# Patient Record
Sex: Male | Born: 1995 | Race: Black or African American | Hispanic: No | Marital: Single | Smoking: Never smoker
Health system: Southern US, Community
[De-identification: ages and names within clinical notes are randomized; demographics above are authoritative.]

## PROBLEM LIST (undated history)

## (undated) DIAGNOSIS — Q249 Congenital malformation of heart, unspecified: Secondary | ICD-10-CM

## (undated) HISTORY — PX: CARDIAC SURGERY: SHX584

---
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
## 2017-02-19 ENCOUNTER — Emergency Department (HOSPITAL_COMMUNITY): Payer: Medicaid Other

## 2017-02-19 ENCOUNTER — Encounter (HOSPITAL_COMMUNITY): Payer: Self-pay

## 2017-02-19 ENCOUNTER — Emergency Department (HOSPITAL_COMMUNITY)
Admission: EM | Admit: 2017-02-19 | Discharge: 2017-02-19 | Disposition: A | Discharge status: Home / Self Care | Payer: Medicaid Other | Attending: Emergency Medicine | Admitting: Emergency Medicine

## 2017-02-19 DIAGNOSIS — N451 Epididymitis: Secondary | ICD-10-CM | POA: Diagnosis not present

## 2017-02-19 DIAGNOSIS — N50811 Right testicular pain: Secondary | ICD-10-CM | POA: Diagnosis present

## 2017-02-19 DIAGNOSIS — R52 Pain, unspecified: Secondary | ICD-10-CM

## 2017-02-19 LAB — URINALYSIS, ROUTINE W REFLEX MICROSCOPIC
Bacteria, UA: NONE SEEN
Bilirubin Urine: NEGATIVE
GLUCOSE, UA: NEGATIVE mg/dL
KETONES UR: NEGATIVE mg/dL
NITRITE: NEGATIVE
PROTEIN: NEGATIVE mg/dL
Specific Gravity, Urine: 1.019 (ref 1.005–1.030)
pH: 6 (ref 5.0–8.0)

## 2017-02-19 MED ORDER — CIPROFLOXACIN HCL 500 MG PO TABS: 500.0000 mg | ORAL_TABLET | Freq: Two times a day (BID) | ORAL | 0 refills | Status: DC

## 2017-02-19 MED ORDER — HYDROCODONE-ACETAMINOPHEN 5-325 MG PO TABS
1.0000 | ORAL_TABLET | Freq: Once | ORAL | Status: AC
  Administered 2017-02-19: 1 via ORAL
  Filled 2017-02-19: qty 1

## 2017-02-19 MED ORDER — HYDROCODONE-ACETAMINOPHEN 5-325 MG PO TABS: 1.0000 | ORAL_TABLET | Freq: Four times a day (QID) | ORAL | 0 refills | Status: DC | PRN

## 2017-02-19 NOTE — Discharge Instructions (Signed)
Cipro as prescribed.  Ibuprofen 600 g every 6 hours as needed for pain. Hydrocodone as prescribed as needed for pain not relieved with ibuprofen.  Follow-up with your primary Dr. if not improving in the next 3-4 days, and return to the ER symptoms significantly worsen or change.

## 2017-02-19 NOTE — ED Triage Notes (Signed)
Per Pt, Pt reports right testicular pain that started yesterday. Pt denies any discharge or urinary symptoms. Reports that the left testicle is noted to be higher than the left one noted yesterday. Swelling in the right testicle.

## 2017-02-19 NOTE — ED Provider Notes (Signed)
[facility]-EMERGENCY DEPT Provider Note   CSN: [id number] Arrival date & time: 02/19/17  1250     History   Chief Complaint Chief Complaint  Patient presents with  . Testicle Pain    HPI Cole Hebert is a 21 y.o. male.  Patient is a 21 year old male otherwise healthy who presents for evaluation of right testicle pain. This began yesterday and is worsening. He describes pain with light touch and movement. He denies any difficulty urinating. He denies any fevers or chills. He has been sexually active in the past, but denies any sexual activity for several months.      Past Medical History:  Diagnosis Date  . Congenital heart defect     There are no active problems to display for this patient.   History reviewed. No pertinent surgical history.     Home Medications    Prior to Admission medications   Medication Sig Start Date End Date Taking? Authorizing Provider  azithromycin (ZITHROMAX) 250 MG tablet Take 1 tablet (250 mg total) by mouth daily. Take first 2 tablets together, then 1 every day until finished. 11/30/15   Danelle Berry, PA-C  enalapril (VASOTEC) 10 MG tablet Take 10 mg by mouth daily.    Historical Provider, MD  guaiFENesin-codeine 100-10 MG/5ML syrup Take 5 mLs by mouth 3 (three) times daily as needed for cough. 11/30/15   Danelle Berry, PA-C  ibuprofen (ADVIL,MOTRIN) 200 MG tablet Take 200 mg by mouth every 6 (six) hours as needed for mild pain.    Historical Provider, MD    Family History No family history on file.  Social History Social History  Substance Use Topics  . Smoking status: Never Smoker  . Smokeless tobacco: Never Used  . Alcohol use No     Allergies   Patient has no known allergies.   Review of Systems Review of Systems  All other systems reviewed and are negative.    Physical Exam Updated Vital Signs BP 123/70 (BP Location: Left Arm)   Pulse 67   Temp 97.3 F (36.3 C) (Oral)   Resp 16   SpO2 100%   Physical Exam    Constitutional: He is oriented to person, place, and time. He appears well-developed and well-nourished. No distress.  HENT:  Head: Normocephalic and atraumatic.  Mouth/Throat: Oropharynx is clear and moist.  Neck: Normal range of motion. Neck supple.  Cardiovascular: Normal rate and regular rhythm.  Exam reveals no friction rub.   No murmur heard. Pulmonary/Chest: Effort normal.  Genitourinary: Penis normal.  Genitourinary Comments: There is exquisite tenderness of the right testicle. There is no obvious swelling or redness. Exam is somewhat limited secondary to patient noncompliance, however the testicle does not appear to be high riding and appears mobile.  Musculoskeletal: Normal range of motion. He exhibits no edema.  Neurological: He is alert and oriented to person, place, and time. Coordination normal.  Skin: Skin is warm and dry. He is not diaphoretic.  Nursing note and vitals reviewed.    ED Treatments / Results  Labs (all labs ordered are listed, but only abnormal results are displayed) Labs Reviewed  URINALYSIS, ROUTINE W REFLEX MICROSCOPIC    EKG  EKG Interpretation None       Radiology No results found.  Procedures Procedures (including critical care time)  Medications Ordered in ED Medications  HYDROcodone-acetaminophen (NORCO/VICODIN) 5-325 MG per tablet 1 tablet (not administered)     Initial Impression / Assessment and Plan / ED Course  I have reviewed the  triage vital signs and the nursing notes.  Pertinent labs & imaging results that were available during my care of the patient were reviewed by me and considered in my medical decision making (see chart for details).  Ultrasound shows epididymitis. This will be treated with Cipro, pain medication, and as needed follow-up with his primary doctor.  Final Clinical Impressions(s) / ED Diagnoses   Final diagnoses:  None    New Prescriptions New Prescriptions   No medications on file      Geoffery Lyonsouglas Davien Malone, MD 02/19/17 1702

## 2017-09-08 ENCOUNTER — Encounter (HOSPITAL_COMMUNITY): Payer: Self-pay | Admitting: Emergency Medicine

## 2017-09-08 ENCOUNTER — Emergency Department (HOSPITAL_COMMUNITY)
Admission: EM | Admit: 2017-09-08 | Discharge: 2017-09-08 | Disposition: A | Discharge status: Home / Self Care | Payer: Medicaid Other | Attending: Emergency Medicine | Admitting: Emergency Medicine

## 2017-09-08 DIAGNOSIS — N50812 Left testicular pain: Secondary | ICD-10-CM | POA: Diagnosis present

## 2017-09-08 DIAGNOSIS — Z79899 Other long term (current) drug therapy: Secondary | ICD-10-CM | POA: Diagnosis not present

## 2017-09-08 DIAGNOSIS — N341 Nonspecific urethritis: Secondary | ICD-10-CM | POA: Insufficient documentation

## 2017-09-08 DIAGNOSIS — N451 Epididymitis: Secondary | ICD-10-CM | POA: Diagnosis not present

## 2017-09-08 DIAGNOSIS — N342 Other urethritis: Secondary | ICD-10-CM

## 2017-09-08 LAB — URINALYSIS, ROUTINE W REFLEX MICROSCOPIC
BACTERIA UA: NONE SEEN
BILIRUBIN URINE: NEGATIVE
GLUCOSE, UA: NEGATIVE mg/dL
KETONES UR: NEGATIVE mg/dL
NITRITE: NEGATIVE
PH: 6 (ref 5.0–8.0)
PROTEIN: 30 mg/dL — AB
SQUAMOUS EPITHELIAL / LPF: NONE SEEN
Specific Gravity, Urine: 1.023 (ref 1.005–1.030)

## 2017-09-08 MED ORDER — AZITHROMYCIN 250 MG PO TABS
1000.0000 mg | ORAL_TABLET | Freq: Once | ORAL | Status: AC
  Administered 2017-09-08: 1000 mg via ORAL
  Filled 2017-09-08: qty 4

## 2017-09-08 MED ORDER — LIDOCAINE HCL (PF) 1 % IJ SOLN
2.0000 mL | Freq: Once | INTRAMUSCULAR | Status: AC
  Administered 2017-09-08: 2 mL
  Filled 2017-09-08: qty 5

## 2017-09-08 MED ORDER — AZITHROMYCIN 250 MG PO TABS: ORAL_TABLET | ORAL | 0 refills | Status: DC

## 2017-09-08 MED ORDER — CEFTRIAXONE SODIUM 250 MG IJ SOLR
250.0000 mg | Freq: Once | INTRAMUSCULAR | Status: AC
  Administered 2017-09-08: 250 mg via INTRAMUSCULAR
  Filled 2017-09-08: qty 250

## 2017-09-08 NOTE — ED Provider Notes (Signed)
[facility]EM[facility] DEPT Provider Note   CSN: [id number] Arrival date & time: 09/08/17  1737     History   Chief Complaint Chief Complaint  Patient presents with  . Testicle Pain    HPI Cole Hebert is a 21 y.o. male. Chief complaint is testicle pain, and urethral discharge.  HPI 21 year old male. Denies any prior STIs. States he's had urethral discharge for a week. He has some soreness in his left testicle 5 days ago. Now his right testicle feels swollen, for the last 3 days. It has been constant, not intermittent. No trauma..  No history of suicidal blisters, ulcers, chancres, no inguinal pain swelling or lymph node enlargement.  Past Medical History:  Diagnosis Date  . Congenital heart defect     There are no active problems to display for this patient.   History reviewed. No pertinent surgical history.     Home Medications    Prior to Admission medications   Medication Sig Start Date End Date Taking? Authorizing Provider  azithromycin (ZITHROMAX Z-PAK) 250 MG tablet As directed 09/08/17   Rolland Porter, MD  ciprofloxacin (CIPRO) 500 MG tablet Take 1 tablet (500 mg total) by mouth 2 (two) times daily. One po bid x 7 days 02/19/17   Geoffery Lyons, MD  enalapril (VASOTEC) 10 MG tablet Take 10 mg by mouth daily.    [provider]  guaiFENesin-codeine 100-10 MG/5ML syrup Take 5 mLs by mouth 3 (three) times daily as needed for cough. Patient not taking: Reported on 02/19/2017 11/30/15   Danelle Berry, PA-C  HYDROcodone-acetaminophen (NORCO) 5-325 MG tablet Take 1-2 tablets by mouth every 6 (six) hours as needed. 02/19/17   Geoffery Lyons, MD    Family History No family history on file.  Social History Social History  Substance Use Topics  . Smoking status: Current Every Day Smoker  . Smokeless tobacco: Never Used  . Alcohol use No     Allergies   Patient has no known allergies.   Review of Systems Review of Systems  Constitutional: Negative for appetite  change, chills, diaphoresis, fatigue and fever.  HENT: Negative for mouth sores, sore throat and trouble swallowing.   Eyes: Negative for visual disturbance.  Respiratory: Negative for cough, chest tightness, shortness of breath and wheezing.   Cardiovascular: Negative for chest pain.  Gastrointestinal: Negative for abdominal distention, abdominal pain, diarrhea, nausea and vomiting.  Endocrine: Negative for polydipsia, polyphagia and polyuria.  Genitourinary: Positive for discharge and testicular pain. Negative for dysuria, frequency and hematuria.  Musculoskeletal: Negative for gait problem.  Skin: Negative for color change, pallor and rash.  Neurological: Negative for dizziness, syncope, light-headedness and headaches.  Hematological: Does not bruise/bleed easily.  Psychiatric/Behavioral: Negative for behavioral problems and confusion.     Physical Exam Updated Vital Signs BP 114/64 (BP Location: Right Arm)   Pulse 82   Temp 98.5 F (36.9 C) (Oral)   Resp 17   Ht  (1.676 m)   Wt 51.8 kg (114 lb 4.8 oz)   SpO2 99%   BMI 18.45 kg/m   Physical Exam  Constitutional: He is oriented to person, place, and time. He appears well-developed and well-nourished. No distress.  HENT:  Head: Normocephalic.  Eyes: Pupils are equal, round, and reactive to light. Conjunctivae are normal. No scleral icterus.  Neck: Normal range of motion. Neck supple. No thyromegaly present.  Cardiovascular: Normal rate and regular rhythm.  Exam reveals no gallop and no friction rub.   No murmur heard. Pulmonary/Chest: Effort  normal and breath sounds normal. No respiratory distress. He has no wheezes. He has no rales.  Abdominal: Soft. Bowel sounds are normal. He exhibits no distension. There is no tenderness. There is no rebound.  Genitourinary:  Genitourinary Comments: No inguinal adenopathy. No ulcerations, sores, or blistering of the shaft, glans, or foreskin. There is some clear discharge. Left  testicle exam is normal. The right testicle examines suggestive of tenderness along the posterior aspect of the testicle. The testicle is not high riding. It is of normal vertical lie. There is a normal Prehns sign. Normal cremasteric reflexes bilaterally.  Musculoskeletal: Normal range of motion.  Neurological: He is alert and oriented to person, place, and time.  Skin: Skin is warm and dry. No rash noted.  Psychiatric: He has a normal mood and affect. His behavior is normal.     ED Treatments / Results  Labs (all labs ordered are listed, but only abnormal results are displayed) Labs Reviewed  URINALYSIS, ROUTINE W REFLEX MICROSCOPIC - Abnormal; Notable for the following:       Result Value   APPearance CLOUDY (*)    Hgb urine dipstick SMALL (*)    Protein, ur 30 (*)    Leukocytes, UA LARGE (*)    Non Squamous Epithelial 0-5 (*)    All other components within normal limits  GC/CHLAMYDIA PROBE AMP ([place]) NOT AT [facility]    EKG  EKG Interpretation None       Radiology No results found.  Procedures Procedures (including critical care time)  Medications Ordered in ED Medications  azithromycin (ZITHROMAX) tablet 1,000 mg (not administered)  cefTRIAXone (ROCEPHIN) injection 250 mg (not administered)     Initial Impression / Assessment and Plan / ED Course  I have reviewed the triage vital signs and the nursing notes.  Pertinent labs & imaging results that were available during my care of the patient were reviewed by me and considered in my medical decision making (see chart for details).     No concern of torsion. Constant pain. Previous Left sided pain. Reassuring Prehns and cremasteric. UA with TNTC WBC. GC and Chamydia ordered. Will treat for Epididymitis and urethritis with IM rocephin, PO zithromax here. Rx for zithromax for 5 days. Partner eval recommended.  Final Clinical Impressions(s) / ED Diagnoses   Final diagnoses:  Epididymitis  Urethritis    New  Prescriptions New Prescriptions   AZITHROMYCIN (ZITHROMAX Z-PAK) 250 MG TABLET    As directed     Rolland Porter, MD 09/08/17 1947

## 2017-09-08 NOTE — ED Notes (Signed)
No s/s of reaction noted.  Discharged at this time.

## 2017-09-08 NOTE — ED Notes (Signed)
Rocephin injection given at this time.  To discharge once no reaction noted.

## 2017-09-08 NOTE — ED Triage Notes (Signed)
C/o constant R testicle pain x 2-3 days and intermittent L testicle pain.  Reports burning with urination and clear penile discharge.

## 2017-09-08 NOTE — Discharge Instructions (Signed)
Your testicular pain, and penile discharge are from a sexually transmitted infection.  You should notify all sexual partners that you have been diagnosed with a sexually transmitted infection. They need to be evaluated and treated with this can be passed on to other individuals, or back to you.  Fill the prescription for Zithromax. Take it as directed until it is gone or this infection can come back worse, and not go away with additional treatment

## 2017-09-09 LAB — GC/CHLAMYDIA PROBE AMP (~~LOC~~) NOT AT ARMC
CHLAMYDIA, DNA PROBE: POSITIVE — AB
NEISSERIA GONORRHEA: POSITIVE — AB

## 2018-01-30 ENCOUNTER — Other Ambulatory Visit: Payer: Self-pay

## 2018-01-30 ENCOUNTER — Encounter (HOSPITAL_COMMUNITY): Payer: Self-pay

## 2018-01-30 ENCOUNTER — Emergency Department (HOSPITAL_COMMUNITY)
Admission: EM | Admit: 2018-01-30 | Discharge: 2018-01-30 | Disposition: A | Discharge status: Home / Self Care | Payer: Medicaid Other | Attending: Emergency Medicine | Admitting: Emergency Medicine

## 2018-01-30 DIAGNOSIS — H5711 Ocular pain, right eye: Secondary | ICD-10-CM | POA: Diagnosis present

## 2018-01-30 DIAGNOSIS — H109 Unspecified conjunctivitis: Secondary | ICD-10-CM

## 2018-01-30 DIAGNOSIS — H1031 Unspecified acute conjunctivitis, right eye: Secondary | ICD-10-CM | POA: Diagnosis not present

## 2018-01-30 MED ORDER — OFLOXACIN 0.3 % OP SOLN: 1.0000 [drp] | Freq: Four times a day (QID) | OPHTHALMIC | 0 refills | Status: DC

## 2018-01-30 NOTE — ED Triage Notes (Signed)
Pt endorses right eye irritation x 3 days. Redness noted to right eye, no visual changes or fevers. VSS.

## 2018-01-30 NOTE — Discharge Instructions (Signed)
Please follow up with optho in 2 days. Use drops as directed. Follow attached handouts. Do not wear contacts. Wash hands frequently. If you develop worsening or new concerning symptoms you can return to the emergency department for re-evaluation.

## 2018-01-30 NOTE — ED Notes (Signed)
Please note: Visual acuity test was performed without glasses. Pt was asked if he wears glasses or contacts at home, and his mother stated that "he doesn't at all, but he's supposed to". Pt stated that he is supposedly farsighted.

## 2018-01-30 NOTE — ED Provider Notes (Signed)
[facility]   CSN: [id number] Arrival date & time: 01/30/18  1319     History   Chief Complaint Chief Complaint  Patient presents with  . Conjunctivitis    HPI Cole Hebert is a 22 y.o. male who presents for right arm redness.  Patient states that his friend recently was diagnosed with pinkeye.  He notes 3 days of eye redness, clear discharge and crusting shut in the morning.  He has not tried any interventions for his symptoms.  Denies any left eye redness or preceding URI symptoms. Denies fever, HA, N/V, loss of vision, changes in vision, flashers, floaters, blurring, diplopia, photophobia, FB sensation,  trauma, rash, pain or painful EOM.  Patient does note that he is supposed to wear glasses but does not.  No contact use.   HPI  Past Medical History:  Diagnosis Date  . Congenital heart defect     There are no active problems to display for this patient.   History reviewed. No pertinent surgical history.     Home Medications    Prior to Admission medications   Medication Sig Start Date End Date Taking? Authorizing Provider  azithromycin (ZITHROMAX Z-PAK) 250 MG tablet As directed 09/08/17   Rolland PorterJames, Mark, MD  ciprofloxacin (CIPRO) 500 MG tablet Take 1 tablet (500 mg total) by mouth 2 (two) times daily. One po bid x 7 days 02/19/17   Geoffery Lyonselo, Douglas, MD  enalapril (VASOTEC) 10 MG tablet Take 10 mg by mouth daily.    [provider]  guaiFENesin-codeine 100-10 MG/5ML syrup Take 5 mLs by mouth 3 (three) times daily as needed for cough. Patient not taking: Reported on 02/19/2017 11/30/15   Danelle Berryapia, Leisa, PA-C  HYDROcodone-acetaminophen (NORCO) 5-325 MG tablet Take 1-2 tablets by mouth every 6 (six) hours as needed. 02/19/17   Geoffery Lyonselo, Douglas, MD    Family History History reviewed. No pertinent family history.  Social History Social History   Tobacco Use  . Smoking status: Never Smoker  . Smokeless tobacco: Never  Used  Substance Use Topics  . Alcohol use: No  . Drug use: Yes    Types: Marijuana    Comment: every other day     Allergies   Patient has no known allergies.   Review of Systems Review of Systems  All other systems reviewed and are negative.    Physical Exam Updated Vital Signs BP 126/67 (BP Location: Right Arm)   Temp 98 F (36.7 C) (Oral)   Resp 16   Ht 5\' 6"  (1.676 m)   Wt 51.7 kg (114 lb)   SpO2 99%   BMI 18.40 kg/m   Physical Exam  Constitutional: He appears well-developed and well-nourished. No distress.  HENT:  Head: Normocephalic and atraumatic.  Right Ear: Hearing, tympanic membrane, external ear and ear canal normal. No foreign bodies. Tympanic membrane is not injected, not perforated, not erythematous, not retracted and not bulging.  Left Ear: Hearing, tympanic membrane, external ear and ear canal normal. No foreign bodies. Tympanic membrane is not injected, not perforated, not erythematous, not retracted and not bulging.  Nose: Nose normal. No mucosal edema, rhinorrhea, sinus tenderness, septal deviation or nasal septal hematoma.  No foreign bodies. Right sinus exhibits no maxillary sinus tenderness and no frontal sinus tenderness. Left sinus exhibits no maxillary sinus tenderness and no frontal sinus tenderness.  The patient has normal phonation and is in control of secretions. No stridor.  Midline uvula without edema. Soft palate rises  symmetrically.  No tonsillar erythema or exudates. No PTA. Tongue protrusion is normal. No trismus. No creptius on neck palpation and patient has good dentition. No gingival erythema or fluctuance noted. Mucus membranes moist.  Eyes: Conjunctivae, EOM and lids are normal. Pupils are equal, round, and reactive to light. Right eye exhibits no discharge. Left eye exhibits no discharge. Right conjunctiva is not injected. Left conjunctiva is not injected.  Appearance (Right) There is erythema of the conjunctiva and sclera. No scleral  icterus. Mild watery discharge. Crusting on the lower eyelid. PEERL intact. EOMI without nystagmus or pain. No photophobia or consensual photophobia.  Corneal Abrasion Exam VCO. Risks, benefits and alternatives explained. 1 drops of tetracaine (PONTOCAINE) 0.5 % ophthalmic solution were applied to the right eye. Fluorescein 1 MG ophthalmic strip applied the the surface of the right eye Wood's lamp used to screen for abrasion. No increased fluorescein uptake. No corneal ulcer. Negative Seidel sign. No foreign bodies noted. No visible hyphema.  Patient tolerated the procedure well No periorbital swelling or erythema.  Neck: Trachea normal, normal range of motion, full passive range of motion without pain and phonation normal. Neck supple. No spinous process tenderness and no muscular tenderness present. No neck rigidity. No tracheal deviation and normal range of motion present.  Cardiovascular: Normal rate and regular rhythm.  Pulmonary/Chest: Effort normal.  Abdominal: Soft.  Lymphadenopathy:       Head (right side): No preauricular adenopathy present.       Head (left side): No preauricular adenopathy present.    He has no cervical adenopathy.  Neurological: He is alert.  Skin: Skin is warm and dry. No rash noted.  No vesicular-like rash visualized  Psychiatric: He has a normal mood and affect.  Nursing note and vitals reviewed.    ED Treatments / Results  Labs (all labs ordered are listed, but only abnormal results are displayed) Labs Reviewed - No data to display  EKG  EKG Interpretation None       Radiology No results found.  Procedures Procedures (including critical care time)  Medications Ordered in ED Medications - No data to display   Initial Impression / Assessment and Plan / ED Course  I have reviewed the triage vital signs and the nursing notes.  Pertinent labs & imaging results that were available during my care of the patient were reviewed by me and  considered in my medical decision making (see chart for details).      22 y.o. male with 3 day history of red eye, drainage and eye crusting in the morning. Pt dx likely  conjunctivitis based on presentation & eye exam.Will d/c on ofloxacin drops.   No evidence of HSV or VSV infection. Pt is not a contact lens wearer.  Exam non-concerning for orbital cellulitis, hyphema, corneal ulcers, corneal abrasions or trauma.  Patient has been instructed to use cool compresses and practice personal hygiene with frequent hand washing.  Patient's visual acuity at baseline.  He is supposed to wear glasses but does not.  Patient understands to follow up with ophthalmology, especially if new symptoms including change in vision, increased drainage, or entrapment occur.  Strict return precautions discussed. Patient appears safe for discharge.   Final Clinical Impressions(s) / ED Diagnoses   Final diagnoses:  Conjunctivitis of right eye, unspecified conjunctivitis type    ED Discharge Orders        Ordered    ofloxacin (OCUFLOX) 0.3 % ophthalmic solution  4 times daily  01/30/18 1515       Jacinto Halim, PA-C 01/30/18 1516    Margarita Grizzle, MD 01/31/18 [phone]

## 2018-01-30 NOTE — ED Notes (Signed)
See provider assessment 

## 2018-04-09 ENCOUNTER — Emergency Department (HOSPITAL_COMMUNITY)
Admission: EM | Admit: 2018-04-09 | Discharge: 2018-04-09 | Disposition: A | Discharge status: Home / Self Care | Payer: Medicaid Other | Attending: Emergency Medicine | Admitting: Emergency Medicine

## 2018-04-09 ENCOUNTER — Encounter (HOSPITAL_COMMUNITY): Payer: Self-pay

## 2018-04-09 ENCOUNTER — Other Ambulatory Visit: Payer: Self-pay

## 2018-04-09 DIAGNOSIS — W2209XA Striking against other stationary object, initial encounter: Secondary | ICD-10-CM | POA: Insufficient documentation

## 2018-04-09 DIAGNOSIS — S65211A Laceration of superficial palmar arch of right hand, initial encounter: Secondary | ICD-10-CM | POA: Insufficient documentation

## 2018-04-09 DIAGNOSIS — Z23 Encounter for immunization: Secondary | ICD-10-CM | POA: Diagnosis not present

## 2018-04-09 DIAGNOSIS — S6991XA Unspecified injury of right wrist, hand and finger(s), initial encounter: Secondary | ICD-10-CM

## 2018-04-09 DIAGNOSIS — Y9389 Activity, other specified: Secondary | ICD-10-CM | POA: Diagnosis not present

## 2018-04-09 DIAGNOSIS — Y9289 Other specified places as the place of occurrence of the external cause: Secondary | ICD-10-CM | POA: Diagnosis not present

## 2018-04-09 DIAGNOSIS — Y999 Unspecified external cause status: Secondary | ICD-10-CM | POA: Insufficient documentation

## 2018-04-09 MED ORDER — TETANUS-DIPHTH-ACELL PERTUSSIS 5-2.5-18.5 LF-MCG/0.5 IM SUSP
0.5000 mL | Freq: Once | INTRAMUSCULAR | Status: AC
  Administered 2018-04-09: 0.5 mL via INTRAMUSCULAR
  Filled 2018-04-09: qty 0.5

## 2018-04-09 NOTE — Discharge Instructions (Signed)
Please follow up with your primary care provider within 5-7 days for re-evaluation of your symptoms. If you do not have a primary care provider, information for a healthcare clinic has been provided for you to make arrangements for follow up care.  Please return to the emergency room immediately if you experience any new or worsening symptoms or any symptoms that indicate worsening infection such as fevers, increased redness/swelling/pain, warmth, or drainage from the affected area.    

## 2018-04-09 NOTE — ED Triage Notes (Signed)
Pt arrived from home, states he cut his right palm on a metal fence yesterday. Unsure of when last tetanus shot was.

## 2018-04-09 NOTE — ED Provider Notes (Signed)
[facility] EMERGENCY DEPARTMENT Provider Note   CSN: [id number] Arrival date & time: 04/09/18  0847     History   Chief Complaint Chief Complaint  Patient presents with  . Hand Injury    HPI Cole Hebert is a 22 y.o. male.  HPI    Pt Is a 22 year old male presenting to the ED today to be evaluated for hand injury that occurred yesterday.  Patient states that he jumped a fence when he was running from a dog yesterday and cut the palm of his right hand on the fence.  He has 3 cuts to his hand.  Denies numbness, tingling, weakness to the hand.  Denies pain to the hand.  He has not had any fevers or signs of infection.  Denies any other injuries.  Denies exacerbating or alleviating factors.  Injury occurred suddenly.  He is not sure when his last tetanus shot was.  Past Medical History:  Diagnosis Date  . Congenital heart defect     There are no active problems to display for this patient.   History reviewed. No pertinent surgical history.      Home Medications    Prior to Admission medications   Medication Sig Start Date End Date Taking? Authorizing Provider  azithromycin (ZITHROMAX Z-PAK) 250 MG tablet As directed 09/08/17   Rolland Porter, MD  ciprofloxacin (CIPRO) 500 MG tablet Take 1 tablet (500 mg total) by mouth 2 (two) times daily. One po bid x 7 days 02/19/17   Geoffery Lyons, MD  enalapril (VASOTEC) 10 MG tablet Take 10 mg by mouth daily.    [provider]  guaiFENesin-codeine 100-10 MG/5ML syrup Take 5 mLs by mouth 3 (three) times daily as needed for cough. Patient not taking: Reported on 02/19/2017 11/30/15   Danelle Berry, PA-C  HYDROcodone-acetaminophen (NORCO) 5-325 MG tablet Take 1-2 tablets by mouth every 6 (six) hours as needed. 02/19/17   Geoffery Lyons, MD  ofloxacin (OCUFLOX) 0.3 % ophthalmic solution Place 1 drop into the right eye 4 (four) times daily. 01/30/18   Maczis, Elmer Sow, PA-C    Family History No family history on  file.  Social History Social History   Tobacco Use  . Smoking status: Never Smoker  . Smokeless tobacco: Never Used  Substance Use Topics  . Alcohol use: No  . Drug use: Yes    Types: Marijuana    Comment: every other day     Allergies   Patient has no known allergies.   Review of Systems Review of Systems  Constitutional: Negative for fever.  Musculoskeletal:       Right hand injury  Skin: Positive for wound.  Neurological: Negative for weakness and numbness.     Physical Exam Updated Vital Signs BP 121/66 (BP Location: Right Arm)   Pulse 72   Temp 98.4 F (36.9 C) (Oral)   Resp 16   Ht  (1.676 m)   Wt 52.2 kg (115 lb)   SpO2 98%   BMI 18.56 kg/m   Physical Exam  Constitutional: He is oriented to person, place, and time. He appears well-developed and well-nourished. No distress.  HENT:  Head: Normocephalic and atraumatic.  Eyes: Conjunctivae are normal.  Cardiovascular: Normal rate.  Pulmonary/Chest: Effort normal.  Musculoskeletal:  Multiple small superficial wounds to right palm. Largest is flap injury to epidermis that is 0.5cm, two <0.5cm wound to palm, no active bleeding, no FB, 5/5 grip strength bilat, normal pincer strength, thumb extension, and finger  abduction to all figners bilat, brisk cap refill and normal sensation  Neurological: He is alert and oriented to person, place, and time.  Skin: Skin is warm and dry.     ED Treatments / Results  Labs (all labs ordered are listed, but only abnormal results are displayed) Labs Reviewed - No data to display  EKG None  Radiology No results found.  Procedures Procedures (including critical care time)  Medications Ordered in ED Medications  Tdap (BOOSTRIX) injection 0.5 mL (has no administration in time range)     Initial Impression / Assessment and Plan / ED Course  I have reviewed the triage vital signs and the nursing notes.  Pertinent labs & imaging results that were available  during my care of the patient were reviewed by me and considered in my medical decision making (see chart for details).    Final Clinical Impressions(s) / ED Diagnoses   Final diagnoses:  Injury of right hand, initial encounter   Patient presenting with right hand wounds to the palm of the hand.  No foreign bodies noted.  Patient irrigated wound under tap water and wash with soap and water in the ED.  No foreign bodies identified through full range of motion.  Tetanus updated.  Do not feel antibiotics are indicated at this time as wounds are clean and patient has no immune suppression that would delay wound healing.  Advised PCP follow-up in a week and return for any signs of infection.  All questions answered and patient understands plan and reasons to return immediately to the ED.  ED Discharge Orders    None       Karrie Meres, [place] 04/09/18 6962    Little, Ambrose Finland, MD 04/09/18 [phone]

## 2018-05-12 ENCOUNTER — Emergency Department (HOSPITAL_COMMUNITY)
Admission: EM | Admit: 2018-05-12 | Discharge: 2018-05-12 | Disposition: A | Discharge status: Home / Self Care | Payer: Medicaid Other | Attending: Emergency Medicine | Admitting: Emergency Medicine

## 2018-05-12 ENCOUNTER — Encounter (HOSPITAL_COMMUNITY): Payer: Self-pay | Admitting: Emergency Medicine

## 2018-05-12 ENCOUNTER — Other Ambulatory Visit: Payer: Self-pay

## 2018-05-12 DIAGNOSIS — N451 Epididymitis: Secondary | ICD-10-CM | POA: Diagnosis not present

## 2018-05-12 DIAGNOSIS — Z79899 Other long term (current) drug therapy: Secondary | ICD-10-CM | POA: Diagnosis not present

## 2018-05-12 DIAGNOSIS — N5089 Other specified disorders of the male genital organs: Secondary | ICD-10-CM | POA: Diagnosis present

## 2018-05-12 LAB — URINALYSIS, ROUTINE W REFLEX MICROSCOPIC
BILIRUBIN URINE: NEGATIVE
GLUCOSE, UA: NEGATIVE mg/dL
Ketones, ur: NEGATIVE mg/dL
Nitrite: NEGATIVE
PH: 6 (ref 5.0–8.0)
PROTEIN: NEGATIVE mg/dL
Specific Gravity, Urine: 1.021 (ref 1.005–1.030)

## 2018-05-12 MED ORDER — DOXYCYCLINE HYCLATE 100 MG PO CAPS: 100.0000 mg | ORAL_CAPSULE | Freq: Two times a day (BID) | ORAL | 0 refills | Status: DC

## 2018-05-12 MED ORDER — CEFTRIAXONE SODIUM 250 MG IJ SOLR
250.0000 mg | Freq: Once | INTRAMUSCULAR | Status: AC
  Administered 2018-05-12: 250 mg via INTRAMUSCULAR
  Filled 2018-05-12: qty 250

## 2018-05-12 MED ORDER — STERILE WATER FOR INJECTION IJ SOLN
INTRAMUSCULAR | Status: AC
  Administered 2018-05-12: 1.2 mL
  Filled 2018-05-12: qty 10

## 2018-05-12 NOTE — ED Provider Notes (Signed)
[facility]   CSN: [id number] Arrival date & time: 05/12/18  1015     History   Chief Complaint Chief Complaint  Patient presents with  . Testicle Pain    HPI Cole Hebert is a 22 y.o. male.  HPI   22 year old male presents today with complaints of left testicular pain.  Patient notes over the last 2 months he has had purulent penile discharge and left testicular pain.  He notes this is worse on the backside of the testicle, denies any swelling or edema to the scrotum, changes in size of the testicle or any masses.  Patient reports he is sexually active with women only.  She denies any abdominal pain fever.  Past Medical History:  Diagnosis Date  . Congenital heart defect     There are no active problems to display for this patient.   Past Surgical History:  Procedure Laterality Date  . CARDIAC SURGERY          Home Medications    Prior to Admission medications   Medication Sig Start Date End Date Taking? Authorizing Provider  azithromycin (ZITHROMAX Z-PAK) 250 MG tablet As directed 09/08/17   Rolland PorterJames, Mark, MD  ciprofloxacin (CIPRO) 500 MG tablet Take 1 tablet (500 mg total) by mouth 2 (two) times daily. One po bid x 7 days 02/19/17   Geoffery Lyonselo, Douglas, MD  doxycycline (VIBRAMYCIN) 100 MG capsule Take 1 capsule (100 mg total) by mouth 2 (two) times daily. 05/12/18   Ainara Eldridge, Tinnie GensJeffrey, PA-C  enalapril (VASOTEC) 10 MG tablet Take 10 mg by mouth daily.    [provider]  guaiFENesin-codeine 100-10 MG/5ML syrup Take 5 mLs by mouth 3 (three) times daily as needed for cough. Patient not taking: Reported on 02/19/2017 11/30/15   Danelle Berryapia, Leisa, PA-C  HYDROcodone-acetaminophen (NORCO) 5-325 MG tablet Take 1-2 tablets by mouth every 6 (six) hours as needed. 02/19/17   Geoffery Lyonselo, Douglas, MD  ofloxacin (OCUFLOX) 0.3 % ophthalmic solution Place 1 drop into the right eye 4 (four) times daily. 01/30/18   Maczis, Elmer SowMichael M, PA-C     Family History History reviewed. No pertinent family history.  Social History Social History   Tobacco Use  . Smoking status: Never Smoker  . Smokeless tobacco: Never Used  Substance Use Topics  . Alcohol use: No  . Drug use: Yes    Types: Marijuana    Comment: every other day     Allergies   Patient has no known allergies.   Review of Systems Review of Systems  All other systems reviewed and are negative.    Physical Exam Updated Vital Signs BP 132/78 (BP Location: Right Arm)   Pulse 86   Temp 98.1 F (36.7 C) (Oral)   Resp 16   Ht 5\' 6"  (1.676 m)   Wt 49.9 kg (110 lb)   SpO2 98%   BMI 17.75 kg/m   Physical Exam  Constitutional: He is oriented to person, place, and time. He appears well-developed and well-nourished.  HENT:  Head: Normocephalic and atraumatic.  Eyes: Pupils are equal, round, and reactive to light. Conjunctivae are normal. Right eye exhibits no discharge. Left eye exhibits no discharge. No scleral icterus.  Neck: Normal range of motion. No JVD present. No tracheal deviation present.  Pulmonary/Chest: Effort normal. No stridor.  Genitourinary:  Genitourinary Comments: Bilateral descended testicles, no masses, no scrotal swelling, tenderness along the posterior left testicle along the epididymis -no penile discharge  Neurological: He is  alert and oriented to person, place, and time. Coordination normal.  Psychiatric: He has a normal mood and affect. His behavior is normal. Judgment and thought content normal.  Nursing note and vitals reviewed.    ED Treatments / Results  Labs (all labs ordered are listed, but only abnormal results are displayed) Labs Reviewed  URINALYSIS, ROUTINE W REFLEX MICROSCOPIC - Abnormal; Notable for the following components:      Result Value   APPearance HAZY (*)    Hgb urine dipstick MODERATE (*)    Leukocytes, UA MODERATE (*)    WBC, UA >50 (*)    Bacteria, UA RARE (*)    All other components within  normal limits  GC/CHLAMYDIA PROBE AMP ([place]) NOT AT [facility]    EKG None  Radiology No results found.  Procedures Procedures (including critical care time)  Medications Ordered in ED Medications  cefTRIAXone (ROCEPHIN) injection 250 mg (250 mg Intramuscular Given 05/12/18 1217)  sterile water (preservative free) injection (1.2 mLs  Given 05/12/18 1217)     Initial Impression / Assessment and Plan / ED Course  I have reviewed the triage vital signs and the nursing notes.  Pertinent labs & imaging results that were available during my care of the patient were reviewed by me and considered in my medical decision making (see chart for details).     22 year old male presents today with likely epididymitis secondary to STD.  Patient having penile discharge.  He will be treated with ceftriaxone and doxy at home.  No signs of trichomoniasis in his urine, he is discharged with strict return precautions and follow-up information.  If patient continues to endorse testicular pain after antibiotics he will return for reevaluation.  Final Clinical Impressions(s) / ED Diagnoses   Final diagnoses:  Epididymitis    ED Discharge Orders        Ordered    doxycycline (VIBRAMYCIN) 100 MG capsule  2 times daily     05/12/18 1247       Freedom Peddy, Josie Dixon 05/12/18 1247    Benjiman Core, MD 05/12/18 [phone]

## 2018-05-12 NOTE — Discharge Instructions (Addendum)
Please read attached information. If you experience any new or worsening signs or symptoms please return to the emergency room for evaluation. Please follow-up with your primary care provider or specialist as discussed. Please use medication prescribed only as directed and discontinue taking if you have any concerning signs or symptoms.   °

## 2018-05-12 NOTE — ED Triage Notes (Signed)
Pt reports left testicular pain for the last several months. Denies recent fever. No new sexual contacts. Sexual preference with women. Reports yellow/white semen. Some penile discharge.

## 2018-05-13 LAB — GC/CHLAMYDIA PROBE AMP (~~LOC~~) NOT AT ARMC
Chlamydia: POSITIVE — AB
Neisseria Gonorrhea: NEGATIVE

## 2018-07-12 ENCOUNTER — Emergency Department (HOSPITAL_COMMUNITY): Payer: Medicaid Other

## 2018-07-12 ENCOUNTER — Encounter (HOSPITAL_COMMUNITY): Payer: Self-pay | Admitting: Emergency Medicine

## 2018-07-12 ENCOUNTER — Other Ambulatory Visit: Payer: Self-pay

## 2018-07-12 ENCOUNTER — Emergency Department (HOSPITAL_COMMUNITY)
Admission: EM | Admit: 2018-07-12 | Discharge: 2018-07-12 | Disposition: A | Discharge status: Home / Self Care | Payer: Medicaid Other | Attending: Emergency Medicine | Admitting: Emergency Medicine

## 2018-07-12 DIAGNOSIS — R000 Tachycardia, unspecified: Secondary | ICD-10-CM | POA: Diagnosis present

## 2018-07-12 DIAGNOSIS — Z79899 Other long term (current) drug therapy: Secondary | ICD-10-CM | POA: Insufficient documentation

## 2018-07-12 DIAGNOSIS — R002 Palpitations: Secondary | ICD-10-CM | POA: Insufficient documentation

## 2018-07-12 LAB — BASIC METABOLIC PANEL
Anion gap: 11 (ref 5–15)
BUN: 12 mg/dL (ref 6–20)
CO2: 27 mmol/L (ref 22–32)
Calcium: 9.5 mg/dL (ref 8.9–10.3)
Chloride: 103 mmol/L (ref 98–111)
Creatinine, Ser: 0.93 mg/dL (ref 0.61–1.24)
GFR calc Af Amer: >60 mL/min (ref >60)
GFR calc non Af Amer: >60 mL/min (ref >60)
Glucose, Bld: 129 mg/dL — ABNORMAL HIGH (ref 70–99)
Potassium: 3.5 mmol/L (ref 3.5–5.1)
Sodium: 141 mmol/L (ref 135–145)

## 2018-07-12 LAB — I-STAT TROPONIN, ED: Troponin i, poc: 0.01 ng/mL (ref 0.00–0.08)

## 2018-07-12 LAB — CBC
HCT: 43.5 % (ref 39.0–52.0)
Hemoglobin: 14.3 g/dL (ref 13.0–17.0)
MCH: 34.3 pg — ABNORMAL HIGH (ref 26.0–34.0)
MCHC: 32.9 g/dL (ref 30.0–36.0)
MCV: 104.3 fL — ABNORMAL HIGH (ref 78.0–100.0)
Platelets: 221 10*3/uL (ref 150–400)
RBC: 4.17 MIL/uL — ABNORMAL LOW (ref 4.22–5.81)
RDW: 11.5 % (ref 11.5–15.5)
WBC: 4.3 10*3/uL (ref 4.0–10.5)

## 2018-07-12 NOTE — ED Provider Notes (Signed)
[facility] Provider Note   CSN: [id number] Arrival date & time: 07/12/18  0240     History   Chief Complaint Chief Complaint  Patient presents with  . Tachycardia    HPI Cole Hebert is a 22 y.o. male.  HPI   22 year old male with palpitations.  Onset very early this morning about an hour prior to arrival.  He felt like his heart was racing.  He did not actually check the rate.  Symptoms resolved shortly after coming to the emergency room.  He currently has no acute complaints.  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  He is on enalapril and reports compliance.  He denies any significant cardiac issues since that time.  Drinking alcohol and smoking marijuana at the time symptoms started.  Denies any other drug use.  No fevers or chills.  No cough.  No unusual leg pain or swelling.  Denies any chest pain.  Denies significant caffeine.  Past Medical History:  Diagnosis Date  . Congenital heart defect     There are no active problems to display for this patient.   Past Surgical History:  Procedure Laterality Date  . CARDIAC SURGERY          Home Medications    Prior to Admission medications   Medication Sig Start Date End Date Taking? Authorizing Provider  azithromycin (ZITHROMAX Z-PAK) 250 MG tablet As directed 09/08/17   Rolland PorterJames, Mark, MD  ciprofloxacin (CIPRO) 500 MG tablet Take 1 tablet (500 mg total) by mouth 2 (two) times daily. One po bid x 7 days 02/19/17   Geoffery Lyonselo, Douglas, MD  doxycycline (VIBRAMYCIN) 100 MG capsule Take 1 capsule (100 mg total) by mouth 2 (two) times daily. 05/12/18   Hedges, Tinnie GensJeffrey, PA-C  enalapril (VASOTEC) 10 MG tablet Take 10 mg by mouth daily.    [provider]  guaiFENesin-codeine 100-10 MG/5ML syrup Take 5 mLs by mouth 3 (three) times daily as needed for cough. Patient not  taking: Reported on 02/19/2017 11/30/15   Danelle Berryapia, Leisa, PA-C  HYDROcodone-acetaminophen (NORCO) 5-325 MG tablet Take 1-2 tablets by mouth every 6 (six) hours as needed. 02/19/17   Geoffery Lyonselo, Douglas, MD  ofloxacin (OCUFLOX) 0.3 % ophthalmic solution Place 1 drop into the right eye 4 (four) times daily. 01/30/18   Maczis, Elmer SowMichael M, PA-C    Family History No family history on file.  Social History Social History   Tobacco Use  . Smoking status: Never Smoker  . Smokeless tobacco: Never Used  Substance Use Topics  . Alcohol use: Yes  . Drug use: Yes    Types: Marijuana    Comment: every other day     Allergies   Patient has no known allergies.   Review of Systems Review of Systems  All systems reviewed and negative, other than as noted in HPI.  Physical Exam Updated Vital Signs BP 121/73 (BP Location: Left Arm)   Pulse 77   Temp 98.8 F (37.1 C) (Oral)   Resp 12   SpO2 100%   Physical Exam  Constitutional: He appears well-developed and well-nourished. No distress.  HENT:  Head: Normocephalic and atraumatic.  Eyes: Conjunctivae are normal. Right eye exhibits no discharge. Left eye exhibits no discharge.  Neck: Neck supple.  Cardiovascular: Normal rate and regular rhythm. Exam reveals no gallop and no  friction rub.  Murmur heard. Pulmonary/Chest: Effort normal and breath sounds normal. No respiratory distress.  Abdominal: Soft. He exhibits no distension. There is no tenderness.  Musculoskeletal: He exhibits no edema or tenderness.  Neurological: He is alert.  Skin: Skin is warm and dry.  Psychiatric: He has a normal mood and affect. His behavior is normal. Thought content normal.  Nursing note and vitals reviewed.    ED Treatments / Results  Labs (all labs ordered are listed, but only abnormal results are displayed) Labs Reviewed  BASIC METABOLIC PANEL - Abnormal; Notable for the following components:      Result Value   Glucose, Bld 129 (*)    All other components  within normal limits  CBC - Abnormal; Notable for the following components:   RBC 4.17 (*)    MCV 104.3 (*)    MCH 34.3 (*)    All other components within normal limits  I-STAT TROPONIN, ED    EKG EKG Interpretation  Date/Time:  Saturday July 12 2018 02:51:20 EDT Ventricular Rate:  96 PR Interval:  148 QRS Duration: 142 QT Interval:  372 QTC Calculation: 469 R Axis:   70 Text Interpretation:  Normal sinus rhythm Right bundle branch block Abnormal ECG No previous ECGs available Confirmed by Zadie Rhine ([id number]) on 07/12/2018 3:00:39 AM   Radiology Dg Chest 2 View  Result Date: 07/12/2018 CLINICAL DATA:  Heart racing. EXAM: CHEST - 2 VIEW COMPARISON:  11/30/2015 FINDINGS: Post remote median sternotomy and mediastinal surgical clips. Mild cardiac enlargement with unchanged mediastinal contours. No pulmonary edema, pleural effusion, focal airspace disease or pneumothorax. No acute osseous abnormality. IMPRESSION: No acute pulmonary process. Electronically Signed   By: Rubye Oaks M.D.   On: 07/12/2018 03:57    Procedures Procedures (including critical care time)  Medications Ordered in ED Medications - No data to display   Initial Impression / Assessment and Plan / ED Course  I have reviewed the triage vital signs and the nursing notes.  Pertinent labs & imaging results that were available during my care of the patient were reviewed by me and considered in my medical decision making (see chart for details).     22yM with palpitations.  Aside from his past history of tetralogy of fallot, no other particular concerning features.  Asymptomatic.  He dynamically stable.  EKG shows right bundle branch block but not surprising given his past history.  No immediately available prior tracing for comparison purposes.  Basic labs pretty unremarkable.  Advised to drink in moderation avoid substance abuse.  Emergent return questions were discussed.  Outpatient follow-up  otherwise.  It has been determined that no acute conditions requiring further emergency intervention are present at this time. The patient has been advised of the diagnosis and plan. I reviewed any labs and imaging including any potential incidental findings. We have discussed signs and symptoms that warrant return to the ED and they are listed in the discharge instructions.    Final Clinical Impressions(s) / ED Diagnoses   Final diagnoses:  Palpitations    ED Discharge Orders    None       Raeford Razor, MD 07/12/18 [phone]

## 2018-07-12 NOTE — ED Notes (Signed)
Patient verbalizes understanding of discharge instructions. Opportunity for questioning and answers were provided. Armband removed by staff, pt discharged from ED.  

## 2018-07-12 NOTE — ED Triage Notes (Addendum)
Reports heart racing x 1 hour.  States he thinks it is related to etoh consumption tonight.  Also reports smoking marijuana.  Denies chest pain and sob.  Takes Enalapril.

## 2018-10-23 ENCOUNTER — Emergency Department (HOSPITAL_COMMUNITY)
Admission: EM | Admit: 2018-10-23 | Discharge: 2018-10-23 | Disposition: A | Discharge status: Home / Self Care | Payer: Medicaid Other | Attending: Emergency Medicine | Admitting: Emergency Medicine

## 2018-10-23 ENCOUNTER — Encounter (HOSPITAL_COMMUNITY): Payer: Self-pay | Admitting: Emergency Medicine

## 2018-10-23 DIAGNOSIS — Z79899 Other long term (current) drug therapy: Secondary | ICD-10-CM | POA: Insufficient documentation

## 2018-10-23 DIAGNOSIS — N341 Nonspecific urethritis: Secondary | ICD-10-CM | POA: Insufficient documentation

## 2018-10-23 DIAGNOSIS — N342 Other urethritis: Secondary | ICD-10-CM

## 2018-10-23 DIAGNOSIS — R310 Gross hematuria: Secondary | ICD-10-CM | POA: Diagnosis not present

## 2018-10-23 DIAGNOSIS — R319 Hematuria, unspecified: Secondary | ICD-10-CM | POA: Diagnosis present

## 2018-10-23 LAB — URINALYSIS, ROUTINE W REFLEX MICROSCOPIC
BACTERIA UA: NONE SEEN
BILIRUBIN URINE: NEGATIVE
GLUCOSE, UA: NEGATIVE mg/dL
Ketones, ur: NEGATIVE mg/dL
Nitrite: NEGATIVE
Protein, ur: 100 mg/dL — AB
RBC / HPF: >50 RBC/hpf — ABNORMAL HIGH (ref 0–5)
SPECIFIC GRAVITY, URINE: 1.025 (ref 1.005–1.030)
pH: 6 (ref 5.0–8.0)

## 2018-10-23 MED ORDER — DOXYCYCLINE HYCLATE 100 MG PO CAPS: 100.0000 mg | ORAL_CAPSULE | Freq: Two times a day (BID) | ORAL | 0 refills | Status: DC

## 2018-10-23 MED ORDER — CEPHALEXIN 500 MG PO CAPS: 500.0000 mg | ORAL_CAPSULE | Freq: Two times a day (BID) | ORAL | 0 refills | Status: DC

## 2018-10-23 NOTE — ED Provider Notes (Signed)
[facility] Provider Note   CSN: [id number] Arrival date & time: 10/23/18  1452     History   Chief Complaint Chief Complaint  Patient presents with  . Hematuria    HPI Cole Hebert is a 22 y.o. male.  HPI  22 year old male with a history of tetralogy of falot presents with hematuria and dysuria.  He states this started yesterday and there is a stinging/burning sensation when he urinates.  The blood is between dark and bright red.  No clots.  There is no discharge and he has no concern for STI.  There is no discomfort whenever he is not urinating.  No fevers, flank/back pain, or abdominal pain.  He is on enalapril but no other meds and no new meds.  No muscle pain.  Past Medical History:  Diagnosis Date  . Congenital heart defect     There are no active problems to display for this patient.   Past Surgical History:  Procedure Laterality Date  . CARDIAC SURGERY          Home Medications    Prior to Admission medications   Medication Sig Start Date End Date Taking? Authorizing Provider  azithromycin (ZITHROMAX Z-PAK) 250 MG tablet As directed 09/08/17   Rolland PorterJames, Mark, MD  ciprofloxacin (CIPRO) 500 MG tablet Take 1 tablet (500 mg total) by mouth 2 (two) times daily. One po bid x 7 days 02/19/17   Geoffery Lyonselo, Douglas, MD  doxycycline (VIBRAMYCIN) 100 MG capsule Take 1 capsule (100 mg total) by mouth 2 (two) times daily. One po bid x 7 days 10/23/18   Pricilla LovelessGoldston, Icholas Irby, MD  enalapril (VASOTEC) 10 MG tablet Take 10 mg by mouth daily.    [provider]  guaiFENesin-codeine 100-10 MG/5ML syrup Take 5 mLs by mouth 3 (three) times daily as needed for cough. Patient not taking: Reported on 02/19/2017 11/30/15   Danelle Berryapia, Leisa, PA-C  HYDROcodone-acetaminophen (NORCO) 5-325 MG tablet Take 1-2 tablets by mouth every 6 (six) hours as needed. 02/19/17   Geoffery Lyonselo, Douglas, MD  ofloxacin (OCUFLOX) 0.3 % ophthalmic solution Place 1 drop into the right eye  4 (four) times daily. 01/30/18   Maczis, Elmer SowMichael M, PA-C    Family History History reviewed. No pertinent family history.  Social History Social History   Tobacco Use  . Smoking status: Never Smoker  . Smokeless tobacco: Never Used  Substance Use Topics  . Alcohol use: Yes  . Drug use: Yes    Types: Marijuana    Comment: every other day     Allergies   Patient has no known allergies.   Review of Systems Review of Systems  Constitutional: Negative for fever.  Gastrointestinal: Negative for abdominal pain and vomiting.  Genitourinary: Positive for dysuria and hematuria. Negative for discharge, flank pain, frequency and testicular pain.  Musculoskeletal: Negative for back pain.  All other systems reviewed and are negative.    Physical Exam Updated Vital Signs BP 118/67 (BP Location: Right Arm)   Pulse 91   Temp 98.1 F (36.7 C) (Oral)   Resp 16   Ht 5\' 6"  (1.676 m)   Wt 49.9 kg   SpO2 100%   BMI 17.75 kg/m   Physical Exam  Constitutional: He appears well-developed and well-nourished. No distress.  HENT:  Head: Normocephalic and atraumatic.  Right Ear: External ear normal.  Left Ear: External ear normal.  Nose: Nose normal.  Eyes: Right eye exhibits no discharge. Left eye exhibits no discharge.  Cardiovascular: Normal rate and regular rhythm.  Murmur heard. Pulmonary/Chest: Effort normal and breath sounds normal.  Abdominal: Soft. There is no tenderness. There is no CVA tenderness.  Genitourinary: Right testis shows no swelling and no tenderness. Left testis shows no swelling and no tenderness. Circumcised. No penile erythema or penile tenderness. No discharge found.  Musculoskeletal: He exhibits no edema.  Neurological: He is alert.  Skin: Skin is warm and dry. He is not diaphoretic.  Psychiatric: His mood appears not anxious.  Nursing note and vitals reviewed.    ED Treatments / Results  Labs (all labs ordered are listed, but only abnormal results  are displayed) Labs Reviewed  URINALYSIS, ROUTINE W REFLEX MICROSCOPIC - Abnormal; Notable for the following components:      Result Value   Color, Urine AMBER (*)    APPearance CLOUDY (*)    Hgb urine dipstick LARGE (*)    Protein, ur 100 (*)    Leukocytes, UA MODERATE (*)    RBC / HPF >50 (*)    WBC, UA >50 (*)    All other components within normal limits  URINE CULTURE    EKG None  Radiology No results found.  Procedures Procedures (including critical care time)  Medications Ordered in ED Medications - No data to display   Initial Impression / Assessment and Plan / ED Course  I have reviewed the triage vital signs and the nursing notes.  Pertinent labs & imaging results that were available during my care of the patient were reviewed by me and considered in my medical decision making (see chart for details).     Patient has been seen multiple times in the past for epididymitis.  Why does not have any testicular tenderness or pain now, the pain at the tip of his penis when urinating is suspicious for urethritis.  I will treat him with doxycycline given this as he probably has nongonococcal urethritis.  No discharge seen or noted by patient.  Given the recurrent hematuria he has had in his urine, I will have him follow-up with urology.  This is probably from infections however.  No testicular pain or swelling now to warrant ultrasound or to have a concern for torsion.  Final Clinical Impressions(s) / ED Diagnoses   Final diagnoses:  Gross hematuria  Urethritis    ED Discharge Orders         Ordered    cephALEXin (KEFLEX) 500 MG capsule  2 times daily,   Status:  Discontinued     10/23/18 1607    doxycycline (VIBRAMYCIN) 100 MG capsule  2 times daily     10/23/18 1611           Pricilla Loveless, MD 10/23/18 1644

## 2018-10-23 NOTE — ED Notes (Signed)
Patient verbalizes understanding of discharge instructions. Opportunity for questioning and answers were provided. Armband removed by staff, pt discharged from ED ambulatory.   

## 2018-10-23 NOTE — Discharge Instructions (Signed)
If you develop fever, vomiting, back or flank pain, or any other new/concerning symptoms and return to the ER for evaluation.  Otherwise take the anti-box as directed, even if you are feeling better.

## 2018-10-23 NOTE — ED Triage Notes (Signed)
Pt presents to ED for assessment of 2 days of blood in urine, pink tinged.  Patient denies pain, denies discharge.

## 2018-10-25 LAB — URINE CULTURE

## 2018-10-26 ENCOUNTER — Telehealth: Payer: Self-pay

## 2018-10-26 NOTE — Telephone Encounter (Signed)
Post ED Visit - Positive Culture Follow-up  Culture report reviewed by antimicrobial stewardship pharmacist:  []  Cole Hebert, Pharm.D. []  Cole Hebert, Pharm.D., BCPS AQ-ID [x]  Cole Hebert, Pharm.D., BCPS []  Cole Hebert, [address] BCPS []  Cole Hebert, [address] BCPS, AAHIVP []  Cole Hebert, Pharm.D., BCPS, AAHIVP []  Cole Hebert, PharmD, BCPS []  Cole Hebert, PharmD, BCPS []  Cole Hebert, PharmD, BCPS []  Cole Hebert, PharmD  Positive urine culture Treated with Cephalexin, organism sensitive to the same and no further patient follow-up is required at this time.  Cole Hebert, Cole Hebert 10/26/2018, 11:07 AM

## 2019-01-14 ENCOUNTER — Encounter (HOSPITAL_COMMUNITY): Payer: Self-pay | Admitting: *Deleted

## 2019-01-14 ENCOUNTER — Emergency Department (HOSPITAL_COMMUNITY)
Admission: EM | Admit: 2019-01-14 | Discharge: 2019-01-14 | Disposition: A | Discharge status: Home / Self Care | Payer: Medicaid Other | Attending: Emergency Medicine | Admitting: Emergency Medicine

## 2019-01-14 DIAGNOSIS — Z79899 Other long term (current) drug therapy: Secondary | ICD-10-CM | POA: Diagnosis not present

## 2019-01-14 DIAGNOSIS — H6122 Impacted cerumen, left ear: Secondary | ICD-10-CM | POA: Diagnosis not present

## 2019-01-14 NOTE — ED Provider Notes (Signed)
[facility] EMERGENCY DEPARTMENT Provider Note   CSN: [id number] Arrival date & time: 01/14/19  7169     History   Chief Complaint Chief Complaint  Patient presents with  . Otalgia    HPI Cole Hebert is a 23 y.o. male.  HPI   23 year old male presents today with complaints of cerumen impaction.  Patient notes that started in [place].  He notes he has had similar symptoms in the past that resolved with removal.  Patient denies any fever, denies any surrounding pain, notes an ache in the ear.  Notes some muffled hearing.  Past Medical History:  Diagnosis Date  . Congenital heart defect     There are no active problems to display for this patient.   Past Surgical History:  Procedure Laterality Date  . CARDIAC SURGERY          Home Medications    Prior to Admission medications   Medication Sig Start Date End Date Taking? Authorizing Provider  azithromycin (ZITHROMAX Z-PAK) 250 MG tablet As directed 09/08/17   Rolland Porter, MD  ciprofloxacin (CIPRO) 500 MG tablet Take 1 tablet (500 mg total) by mouth 2 (two) times daily. One po bid x 7 days 02/19/17   Geoffery Lyons, MD  doxycycline (VIBRAMYCIN) 100 MG capsule Take 1 capsule (100 mg total) by mouth 2 (two) times daily. One po bid x 7 days 10/23/18   Pricilla Loveless, MD  enalapril (VASOTEC) 10 MG tablet Take 10 mg by mouth daily.    [provider]  guaiFENesin-codeine 100-10 MG/5ML syrup Take 5 mLs by mouth 3 (three) times daily as needed for cough. Patient not taking: Reported on 02/19/2017 11/30/15   Danelle Berry, PA-C  HYDROcodone-acetaminophen (NORCO) 5-325 MG tablet Take 1-2 tablets by mouth every 6 (six) hours as needed. 02/19/17   Geoffery Lyons, MD  ofloxacin (OCUFLOX) 0.3 % ophthalmic solution Place 1 drop into the right eye 4 (four) times daily. 01/30/18   Maczis, Elmer Sow, PA-C    Family History History reviewed. No pertinent family history.  Social  History Social History   Tobacco Use  . Smoking status: Never Smoker  . Smokeless tobacco: Never Used  Substance Use Topics  . Alcohol use: Yes  . Drug use: Yes    Types: Marijuana    Comment: every other day     Allergies   Patient has no known allergies.   Review of Systems Review of Systems  All other systems reviewed and are negative.    Physical Exam Updated Vital Signs BP 127/74 (BP Location: Right Arm)   Pulse 69   Temp 98 F (36.7 C)   Resp 16   SpO2 98%   Physical Exam Vitals signs and nursing note reviewed.  Constitutional:      Appearance: He is well-developed.  HENT:     Head: Normocephalic and atraumatic.     Comments: Left-sided cerumen impaction with hard wax-no surrounding erythema, no pain with manipulation of the external ear, no mastoid tenderness Eyes:     General: No scleral icterus.       Right eye: No discharge.        Left eye: No discharge.     Conjunctiva/sclera: Conjunctivae normal.     Pupils: Pupils are equal, round, and reactive to light.  Neck:     Musculoskeletal: Normal range of motion.     Vascular: No JVD.     Trachea: No tracheal deviation.  Pulmonary:  Effort: Pulmonary effort is normal.     Breath sounds: No stridor.  Neurological:     Mental Status: He is alert and oriented to person, place, and time.     Coordination: Coordination normal.  Psychiatric:        Behavior: Behavior normal.        Thought Content: Thought content normal.        Judgment: Judgment normal.      ED Treatments / Results  Labs (all labs ordered are listed, but only abnormal results are displayed) Labs Reviewed - No data to display  EKG None  Radiology No results found.  Procedures Procedures (including critical care time)  Medications Ordered in ED Medications - No data to display   Initial Impression / Assessment and Plan / ED Course  I have reviewed the triage vital signs and the nursing notes.  Pertinent labs &  imaging results that were available during my care of the patient were reviewed by me and considered in my medical decision making (see chart for details).     23 year old male presents today with cerumen impaction.  He has very hard wax.  Patient will be discharged with instructions use earwax softening drops follow-up with his primary care for removal.  Patient verbalized understanding and agreement to today's plan  Final Clinical Impressions(s) / ED Diagnoses   Final diagnoses:  Impacted cerumen of left ear    ED Discharge Orders    None       Rosalio Loud 01/14/19 0846    Terrilee Files, MD 01/14/19 [phone]

## 2019-01-14 NOTE — ED Triage Notes (Signed)
Pt in c/o left sided earache for the last few days, no distress noted

## 2019-01-14 NOTE — Discharge Instructions (Signed)
Please read attached information. If you experience any new or worsening signs or symptoms please return to the emergency room for evaluation. Please follow-up with your primary care provider or specialist as discussed.  Please use earwax softening drops as indicated.

## 2019-07-11 ENCOUNTER — Other Ambulatory Visit: Payer: Self-pay

## 2019-07-11 ENCOUNTER — Emergency Department (HOSPITAL_COMMUNITY)
Admission: EM | Admit: 2019-07-11 | Discharge: 2019-07-12 | Disposition: A | Discharge status: Home / Self Care | Payer: Medicaid Other | Attending: Emergency Medicine | Admitting: Emergency Medicine

## 2019-07-11 ENCOUNTER — Encounter (HOSPITAL_COMMUNITY): Payer: Self-pay | Admitting: Emergency Medicine

## 2019-07-11 DIAGNOSIS — Z79899 Other long term (current) drug therapy: Secondary | ICD-10-CM | POA: Diagnosis not present

## 2019-07-11 DIAGNOSIS — N451 Epididymitis: Secondary | ICD-10-CM | POA: Insufficient documentation

## 2019-07-11 DIAGNOSIS — N50812 Left testicular pain: Secondary | ICD-10-CM | POA: Diagnosis present

## 2019-07-11 LAB — URINALYSIS, ROUTINE W REFLEX MICROSCOPIC
Bacteria, UA: NONE SEEN
Bilirubin Urine: NEGATIVE
Glucose, UA: NEGATIVE mg/dL
Hgb urine dipstick: NEGATIVE
Ketones, ur: NEGATIVE mg/dL
Nitrite: NEGATIVE
Protein, ur: NEGATIVE mg/dL
Specific Gravity, Urine: 1.017 (ref 1.005–1.030)
pH: 8 (ref 5.0–8.0)

## 2019-07-11 MED ORDER — CEFTRIAXONE SODIUM 250 MG IJ SOLR
250.0000 mg | Freq: Once | INTRAMUSCULAR | Status: AC
  Administered 2019-07-11: 250 mg via INTRAMUSCULAR
  Filled 2019-07-11: qty 250

## 2019-07-11 MED ORDER — DOXYCYCLINE HYCLATE 100 MG PO TABS
100.0000 mg | ORAL_TABLET | Freq: Once | ORAL | Status: AC
  Administered 2019-07-11: 100 mg via ORAL
  Filled 2019-07-11: qty 1

## 2019-07-11 MED ORDER — DOXYCYCLINE HYCLATE 100 MG PO CAPS: 100.0000 mg | ORAL_CAPSULE | Freq: Two times a day (BID) | ORAL | 0 refills | Status: AC

## 2019-07-11 MED ORDER — LIDOCAINE HCL (PF) 1 % IJ SOLN
5.0000 mL | Freq: Once | INTRAMUSCULAR | Status: AC
  Administered 2019-07-11: 5 mL
  Filled 2019-07-11: qty 5

## 2019-07-11 NOTE — ED Provider Notes (Addendum)
[facility] Provider Note   CSN: [id number] Arrival date & time: 07/11/19  2125    History   Chief Complaint Chief Complaint  Patient presents with  . Testicle Pain    HPI Cole Hebert is a 23 y.o. male with past medical history of epididymitis, gonorrhea, chlamydia, congenital heart defect, presenting to the emergency department with complaint of 1 week of left testicular pain.  His pain is been persistent, not worsening.  He states he is mostly over the upper and posterior aspect of the testicle.  He is currently sexually active with male partners, and states he began using protection recently.  He denies associated dysuria, penile discharge, testicular swelling or redness.  Denies fevers, abdominal pain, pain with bowel movements.  Per chart review, patient has been seen multiple times for similar symptoms, diagnosed with epididymitis.  He has had positive STD cultures for gonorrhea and chlamydia.  States his symptoms are the exact same as prior episodes of epididymitis, which resolved with antibiotics.     The history is provided by the patient and medical records.    Past Medical History:  Diagnosis Date  . Congenital heart defect     There are no active problems to display for this patient.   Past Surgical History:  Procedure Laterality Date  . CARDIAC SURGERY          Home Medications    Prior to Admission medications   Medication Sig Start Date End Date Taking? Authorizing Provider  azithromycin (ZITHROMAX Z-PAK) 250 MG tablet As directed 09/08/17   Rolland PorterJames, Mark, MD  ciprofloxacin (CIPRO) 500 MG tablet Take 1 tablet (500 mg total) by mouth 2 (two) times daily. One po bid x 7 days 02/19/17   Geoffery Lyonselo, Douglas, MD  doxycycline (VIBRAMYCIN) 100 MG capsule Take 1 capsule (100 mg total) by mouth 2 (two) times daily for 10 days. One po bid x 7 days 07/11/19 07/21/19  Glinda Natzke, [place] N, PA-C  enalapril (VASOTEC) 10 MG tablet Take 10 mg by  mouth daily.    [provider]  guaiFENesin-codeine 100-10 MG/5ML syrup Take 5 mLs by mouth 3 (three) times daily as needed for cough. Patient not taking: Reported on 02/19/2017 11/30/15   Danelle Berryapia, Leisa, PA-C  HYDROcodone-acetaminophen (NORCO) 5-325 MG tablet Take 1-2 tablets by mouth every 6 (six) hours as needed. 02/19/17   Geoffery Lyonselo, Douglas, MD  ofloxacin (OCUFLOX) 0.3 % ophthalmic solution Place 1 drop into the right eye 4 (four) times daily. 01/30/18   Maczis, Elmer SowMichael M, PA-C    Family History No family history on file.  Social History Social History   Tobacco Use  . Smoking status: Never Smoker  . Smokeless tobacco: Never Used  Substance Use Topics  . Alcohol use: Yes  . Drug use: Yes    Types: Marijuana    Comment: every other day     Allergies   Patient has no known allergies.   Review of Systems Review of Systems  Constitutional: Negative for fever.  Gastrointestinal: Negative for abdominal pain.  Genitourinary: Positive for testicular pain. Negative for discharge, dysuria, frequency, hematuria, penile pain, penile swelling and scrotal swelling.  All other systems reviewed and are negative.    Physical Exam Updated Vital Signs BP 129/71 (BP Location: Right Arm)   Pulse 81   Temp 98.1 F (36.7 C) (Oral)   Resp 14   Ht 5\' 6"  (1.676 m)   Wt 49.9 kg   SpO2 100%   BMI  17.75 kg/m   Physical Exam Vitals signs and nursing note reviewed. Exam conducted with a chaperone present.  Constitutional:      General: He is not in acute distress.    Appearance: He is well-developed.  HENT:     Head: Normocephalic and atraumatic.  Eyes:     Conjunctiva/sclera: Conjunctivae normal.  Cardiovascular:     Rate and Rhythm: Normal rate.  Pulmonary:     Effort: Pulmonary effort is normal.  Abdominal:     General: Bowel sounds are normal.     Palpations: Abdomen is soft.     Tenderness: There is no abdominal tenderness. There is no guarding or rebound.   Genitourinary:    Penis: Circumcised. No erythema, tenderness, discharge, swelling or lesions.      Scrotum/Testes:        Right: Mass, tenderness or swelling not present.        Left: Mass or swelling not present.     Comments: Exam performed with nurse tech chaperone present.  Left testicle with tenderness over the epididymis and posterior aspect.  No Testicular masses palpable.  No Bell clapper deformity. No scrotal swelling or erythema  Skin:    General: Skin is warm.  Neurological:     Mental Status: He is alert.  Psychiatric:        Behavior: Behavior normal.      ED Treatments / Results  Labs (all labs ordered are listed, but only abnormal results are displayed) Labs Reviewed  URINALYSIS, ROUTINE W REFLEX MICROSCOPIC - Abnormal; Notable for the following components:      Result Value   Leukocytes,Ua SMALL (*)    All other components within normal limits  GC/CHLAMYDIA PROBE AMP ([place]) NOT AT [facility]    EKG None  Radiology No results found.  Procedures Procedures (including critical care time)  Medications Ordered in ED Medications  cefTRIAXone (ROCEPHIN) injection 250 mg (has no administration in time range)  doxycycline (VIBRA-TABS) tablet 100 mg (has no administration in time range)     Initial Impression / Assessment and Plan / ED Course  I have reviewed the triage vital signs and the nursing notes.  Pertinent labs & imaging results that were available during my care of the patient were reviewed by me and considered in my medical decision making (see chart for details).        Patient presenting with 1 week of unchanging testicular pain, with tenderness over the epididymitis on exam.  He has been seen multiple times in the past for what he reports of the same symptoms.  He was diagnosed with epididymitis, treated with antibiotics and had resolution in symptoms.  He also had positive STD cultures of gonorrhea and chlamydia.   Urine today with  leukocytes and 6-10 white cells. Afebrile with normal VS.  Low suspicion for testicular torsion given similarity in symptoms, duration of symptoms that have unchanged, and tenderness over the epididymitis on exam. Had shared decision making with patient regarding ultrasound.  Will treat as outpatient with epididymitis, IM Rocephin administered in the ED with a dose of doxycycline.  Will discharge with p.o. doxycycline.  Encouraged safe sexual practices.  GC chlamydia sent.  Recommended HIV and RPR, however patient declined. Strict return precautions.  Discussed results, findings, treatment and follow up. Patient advised of return precautions. Patient verbalized understanding and agreed with plan.  11:38 PM pt changed his mind at discharge and would like RPR and HIV. Sent to lab. Final Clinical Impressions(s) /  ED Diagnoses   Final diagnoses:  Epididymitis    ED Discharge Orders         Ordered    doxycycline (VIBRAMYCIN) 100 MG capsule  2 times daily     07/11/19 2302           Caryssa Elzey, [place] N, PA-C 07/11/19 [place], [place] N, PA-C 07/11/19 2338    Lennice Sites, DO 07/11/19 2351

## 2019-07-11 NOTE — ED Triage Notes (Signed)
Patient reports chronic intermittent ( several years ) left testicular pain , denies injury / no urinary discomfort .

## 2019-07-11 NOTE — Discharge Instructions (Addendum)
Starting tomorrow morning, take the antibiotic, doxycycline, every 12 hours until gone. You can take ibuprofen every 6 hours as needed for pain. It is recommended that you wear tighter underwear to help provide scrotal elevation. This will help your symptoms. It is recommended that you wear protection with all sexual activity to prevent STDs. Follow-up with your primary care provider Return to the emergency department if you develop fever, pain with bowel movements, or worsening symptoms.

## 2019-07-11 NOTE — ED Notes (Signed)
The pt is here with pain in his testicle since he was 58 or 23 years old no bloody urine no urinary symptoms  He reports that he has been seen here numerus times for the same

## 2019-07-12 LAB — RPR: RPR Ser Ql: NONREACTIVE

## 2019-10-28 ENCOUNTER — Other Ambulatory Visit: Payer: Self-pay

## 2019-10-28 DIAGNOSIS — Z20822 Contact with and (suspected) exposure to covid-19: Secondary | ICD-10-CM

## 2019-10-30 LAB — NOVEL CORONAVIRUS, NAA: SARS-CoV-2, NAA: DETECTED — AB

## 2020-03-09 ENCOUNTER — Emergency Department (HOSPITAL_COMMUNITY)
Admission: EM | Admit: 2020-03-09 | Discharge: 2020-03-09 | Disposition: A | Discharge status: Home / Self Care | Payer: Medicaid Other | Attending: Emergency Medicine | Admitting: Emergency Medicine

## 2020-03-09 ENCOUNTER — Other Ambulatory Visit: Payer: Self-pay | Admitting: Physician Assistant

## 2020-03-09 ENCOUNTER — Encounter (HOSPITAL_COMMUNITY): Payer: Self-pay | Admitting: Emergency Medicine

## 2020-03-09 DIAGNOSIS — R369 Urethral discharge, unspecified: Secondary | ICD-10-CM | POA: Insufficient documentation

## 2020-03-09 DIAGNOSIS — Z79899 Other long term (current) drug therapy: Secondary | ICD-10-CM | POA: Diagnosis not present

## 2020-03-09 LAB — URINALYSIS, ROUTINE W REFLEX MICROSCOPIC
Bilirubin Urine: NEGATIVE
Glucose, UA: NEGATIVE mg/dL
Ketones, ur: NEGATIVE mg/dL
Nitrite: NEGATIVE
Protein, ur: NEGATIVE mg/dL
Specific Gravity, Urine: 1.015 (ref 1.005–1.030)
WBC, UA: >50 WBC/hpf — ABNORMAL HIGH (ref 0–5)
pH: 6 (ref 5.0–8.0)

## 2020-03-09 MED ORDER — LIDOCAINE HCL (PF) 1 % IJ SOLN
INTRAMUSCULAR | Status: AC
  Filled 2020-03-09: qty 5

## 2020-03-09 MED ORDER — DOXYCYCLINE HYCLATE 100 MG PO CAPS: 100.0000 mg | ORAL_CAPSULE | Freq: Two times a day (BID) | ORAL | 0 refills | Status: AC

## 2020-03-09 MED ORDER — CEFTRIAXONE SODIUM 500 MG IJ SOLR
500.0000 mg | Freq: Once | INTRAMUSCULAR | Status: AC
  Administered 2020-03-09: 500 mg via INTRAMUSCULAR
  Filled 2020-03-09: qty 500

## 2020-03-09 NOTE — ED Triage Notes (Signed)
Pt here from home with c/o penile discharge , along with some slight burning when he urinates

## 2020-03-09 NOTE — ED Provider Notes (Signed)
[facility] EMERGENCY DEPARTMENT Provider Note   CSN: [id number] Arrival date & time: 03/09/20  8756     History No chief complaint on file.   Cole Hebert is a 24 y.o. male with PMH of tetralogy of Fallot and numerous episodes of epididymitis who presents to the ED with complaints of penile discharge and slight burning with urination.  Patient endorses 1 sexual contact in the past couple of months and he did not use protection.  For the past few days, he has been experiencing purulent penile discharge with mild dysuria.  He denies any fevers or chills, abdominal pain, nausea vomiting, pain with defecation, hematuria, testicular swelling or pain, or other symptoms.  He states that he recently was tested for HIV and syphilis and does not want testing here today.  HPI     Past Medical History:  Diagnosis Date  . Congenital heart defect     There are no problems to display for this patient.   Past Surgical History:  Procedure Laterality Date  . CARDIAC SURGERY         No family history on file.  Social History   Tobacco Use  . Smoking status: Never Smoker  . Smokeless tobacco: Never Used  Substance Use Topics  . Alcohol use: Yes  . Drug use: Yes    Types: Marijuana    Comment: every other day    Home Medications Prior to Admission medications   Medication Sig Start Date End Date Taking? Authorizing Provider  azithromycin (ZITHROMAX Z-PAK) 250 MG tablet As directed 09/08/17   Rolland Porter, MD  ciprofloxacin (CIPRO) 500 MG tablet Take 1 tablet (500 mg total) by mouth 2 (two) times daily. One po bid x 7 days 02/19/17   Geoffery Lyons, MD  doxycycline (VIBRAMYCIN) 100 MG capsule Take 1 capsule (100 mg total) by mouth 2 (two) times daily for 7 days. 03/09/20 03/16/20  Lorelee New, PA-C  enalapril (VASOTEC) 10 MG tablet Take 10 mg by mouth daily.    [provider]  guaiFENesin-codeine 100-10 MG/5ML syrup Take 5 mLs by mouth 3 (three) times daily  as needed for cough. Patient not taking: Reported on 02/19/2017 11/30/15   Danelle Berry, PA-C  HYDROcodone-acetaminophen (NORCO) 5-325 MG tablet Take 1-2 tablets by mouth every 6 (six) hours as needed. 02/19/17   Geoffery Lyons, MD  ofloxacin (OCUFLOX) 0.3 % ophthalmic solution Place 1 drop into the right eye 4 (four) times daily. 01/30/18   Maczis, Elmer Sow, PA-C    Allergies    Patient has no known allergies.  Review of Systems   Review of Systems  Constitutional: Negative for chills and fever.  Gastrointestinal: Negative for abdominal pain and nausea.  Genitourinary: Positive for discharge. Negative for scrotal swelling and testicular pain.    Physical Exam Updated Vital Signs BP 129/77 (BP Location: Right Arm)   Pulse 73   Temp 98.6 F (37 C) (Oral)   Resp 16   SpO2 100%   Physical Exam Vitals and nursing note reviewed. Exam conducted with a chaperone present.  Constitutional:      Appearance: Normal appearance.  HENT:     Head: Normocephalic and atraumatic.  Eyes:     General: No scleral icterus.    Conjunctiva/sclera: Conjunctivae normal.  Cardiovascular:     Rate and Rhythm: Normal rate and regular rhythm.     Pulses: Normal pulses.     Comments: Loud systolic murmur appreciated diffusely. Pulmonary:     Effort:  Pulmonary effort is normal. No respiratory distress.     Breath sounds: Normal breath sounds.  Abdominal:     General: Abdomen is flat. There is no distension.     Palpations: Abdomen is soft.     Tenderness: There is no abdominal tenderness. There is no guarding.  Genitourinary:    Comments: No testicular tenderness.  No scrotal swelling.  No penile rashes.  No erythema.  Whitish purulent penile discharge expressed. Skin:    General: Skin is dry.     Capillary Refill: Capillary refill takes less than 2 seconds.  Neurological:     Mental Status: He is alert and oriented to person, place, and time.     GCS: GCS eye subscore is 4. GCS verbal subscore is  5. GCS motor subscore is 6.  Psychiatric:        Mood and Affect: Mood normal.        Behavior: Behavior normal.        Thought Content: Thought content normal.      ED Results / Procedures / Treatments   Labs (all labs ordered are listed, but only abnormal results are displayed) Labs Reviewed  URINALYSIS, ROUTINE W REFLEX MICROSCOPIC  GC/CHLAMYDIA PROBE AMP ([place]) NOT AT [facility]    EKG None  Radiology No results found.  Procedures Procedures (including critical care time)  Medications Ordered in ED Medications  cefTRIAXone (ROCEPHIN) injection 500 mg (has no administration in time range)    ED Course  I have reviewed the triage vital signs and the nursing notes.  Pertinent labs & imaging results that were available during my care of the patient were reviewed by me and considered in my medical decision making (see chart for details).    MDM Rules/Calculators/A&P                      Patient is afebrile without abdominal tenderness, abdominal pain or painful bowel movements to indicate prostatitis.  No tenderness to palpation of the testes or epididymis to suggest orchitis or epididymitis.  STD cultures obtained including  gonorrhea and chlamydia. Patient to be discharged with instructions to follow up with PCP. Discussed importance of using protection when sexually active. Pt understands that they have GC/Chlamydia cultures pending and that they will need to inform all sexual partners if results return positive. Patient has been treated prophylactically with Rocephin and prescribed doxycycline.  UA is still pending at time of discharge as patient has become increasingly impatient and it has been over an hour and a half since UA was collected.  He is receiving antibiotics, so do not believe it would change management.  ED return precautions discussed with patient.  Final Clinical Impression(s) / ED Diagnoses Final diagnoses:  Penile discharge    Rx / DC Orders ED  Discharge Orders         Ordered    doxycycline (VIBRAMYCIN) 100 MG capsule  2 times daily     03/09/20 1047           Reita Chard 03/09/20 1133    [place], [place], DO 03/09/20 1150

## 2020-03-09 NOTE — ED Notes (Signed)
Patient verbalizes understanding of discharge instructions. Opportunity for questioning and answers were provided. Armband removed by staff, pt discharged from ED.  

## 2020-03-09 NOTE — Discharge Instructions (Signed)
You have been treated presumptively today for gonorrhea and you have been prescribed medication to cover for chlamydia.  Please take your antibiotic, as prescribed.  Take with food.  You have been tested today for gonorrhea and chlamydia. These results will be available in approximately 3 days. You may check your MyChart account for results. Please inform all sexual partners of positive results and that they should be tested and treated as well.  Please wait 2 weeks and be sure that you and your partners are symptom free before returning to sexual activity. Please use protection with every sexual encounter.  Follow Up: Please followup with your primary doctor in 3 days for discussion of your diagnoses and further evaluation after today's visit; if you do not have a primary care doctor use the resource guide provided to find one; Please return to the ER for worsening symptoms, high fevers or persistent vomiting.  

## 2020-03-10 LAB — URINE CYTOLOGY ANCILLARY ONLY
Chlamydia: NEGATIVE
Neisseria Gonorrhea: POSITIVE — AB

## 2020-03-10 LAB — GC/CHLAMYDIA PROBE AMP (~~LOC~~) NOT AT ARMC
Chlamydia: NEGATIVE
Neisseria Gonorrhea: POSITIVE — AB

## 2021-06-30 ENCOUNTER — Inpatient Hospital Stay (HOSPITAL_COMMUNITY)
Admission: EM | Admit: 2021-06-30 | Discharge: 2021-07-02 | DRG: 308 | Disposition: A | Discharge status: Other Acute Inpatient Hospital | Payer: Medicaid Other | Attending: Internal Medicine | Admitting: Internal Medicine

## 2021-06-30 ENCOUNTER — Encounter (HOSPITAL_COMMUNITY): Payer: Self-pay

## 2021-06-30 ENCOUNTER — Emergency Department (HOSPITAL_COMMUNITY): Payer: Medicaid Other

## 2021-06-30 ENCOUNTER — Other Ambulatory Visit: Payer: Self-pay

## 2021-06-30 DIAGNOSIS — I5082 Biventricular heart failure: Secondary | ICD-10-CM | POA: Diagnosis present

## 2021-06-30 DIAGNOSIS — I4510 Unspecified right bundle-branch block: Secondary | ICD-10-CM | POA: Diagnosis present

## 2021-06-30 DIAGNOSIS — I5020 Unspecified systolic (congestive) heart failure: Secondary | ICD-10-CM | POA: Diagnosis present

## 2021-06-30 DIAGNOSIS — Q213 Tetralogy of Fallot: Secondary | ICD-10-CM

## 2021-06-30 DIAGNOSIS — K5900 Constipation, unspecified: Secondary | ICD-10-CM | POA: Diagnosis present

## 2021-06-30 DIAGNOSIS — Z79899 Other long term (current) drug therapy: Secondary | ICD-10-CM

## 2021-06-30 DIAGNOSIS — I5021 Acute systolic (congestive) heart failure: Secondary | ICD-10-CM | POA: Diagnosis not present

## 2021-06-30 DIAGNOSIS — Z20822 Contact with and (suspected) exposure to covid-19: Secondary | ICD-10-CM | POA: Diagnosis present

## 2021-06-30 DIAGNOSIS — Z952 Presence of prosthetic heart valve: Secondary | ICD-10-CM

## 2021-06-30 DIAGNOSIS — I4892 Unspecified atrial flutter: Secondary | ICD-10-CM | POA: Diagnosis present

## 2021-06-30 DIAGNOSIS — I440 Atrioventricular block, first degree: Secondary | ICD-10-CM | POA: Diagnosis present

## 2021-06-30 DIAGNOSIS — I483 Typical atrial flutter: Secondary | ICD-10-CM

## 2021-06-30 DIAGNOSIS — Z8774 Personal history of (corrected) congenital malformations of heart and circulatory system: Secondary | ICD-10-CM

## 2021-06-30 DIAGNOSIS — I509 Heart failure, unspecified: Secondary | ICD-10-CM

## 2021-06-30 DIAGNOSIS — I4891 Unspecified atrial fibrillation: Secondary | ICD-10-CM | POA: Diagnosis present

## 2021-06-30 DIAGNOSIS — M7989 Other specified soft tissue disorders: Secondary | ICD-10-CM | POA: Diagnosis present

## 2021-06-30 DIAGNOSIS — R609 Edema, unspecified: Secondary | ICD-10-CM

## 2021-06-30 LAB — CBC
HCT: 43.7 % (ref 39.0–52.0)
Hemoglobin: 13.9 g/dL (ref 13.0–17.0)
MCH: 35.5 pg — ABNORMAL HIGH (ref 26.0–34.0)
MCHC: 31.8 g/dL (ref 30.0–36.0)
MCV: 111.5 fL — ABNORMAL HIGH (ref 80.0–100.0)
Platelets: 166 10*3/uL (ref 150–400)
RBC: 3.92 MIL/uL — ABNORMAL LOW (ref 4.22–5.81)
RDW: 12.7 % (ref 11.5–15.5)
WBC: 3.8 10*3/uL — ABNORMAL LOW (ref 4.0–10.5)
nRBC: 0 % (ref 0.0–0.2)

## 2021-06-30 LAB — BASIC METABOLIC PANEL
Anion gap: 5 (ref 5–15)
BUN: 11 mg/dL (ref 6–20)
CO2: 28 mmol/L (ref 22–32)
Calcium: 8.9 mg/dL (ref 8.9–10.3)
Chloride: 107 mmol/L (ref 98–111)
Creatinine, Ser: 0.99 mg/dL (ref 0.61–1.24)
GFR, Estimated: >60 mL/min (ref >60)
Glucose, Bld: 125 mg/dL — ABNORMAL HIGH (ref 70–99)
Potassium: 4 mmol/L (ref 3.5–5.1)
Sodium: 140 mmol/L (ref 135–145)

## 2021-06-30 LAB — BRAIN NATRIURETIC PEPTIDE: B Natriuretic Peptide: 1021.5 pg/mL — ABNORMAL HIGH (ref 0.0–100.0)

## 2021-06-30 LAB — TROPONIN I (HIGH SENSITIVITY)
Troponin I (High Sensitivity): 46 ng/L — ABNORMAL HIGH (ref <18)
Troponin I (High Sensitivity): 38 ng/L — ABNORMAL HIGH (ref <18)

## 2021-06-30 MED ORDER — ADENOSINE 6 MG/2ML IV SOLN: 6.0000 mg | Freq: Once | INTRAVENOUS | Status: AC

## 2021-06-30 MED ORDER — AMIODARONE HCL IN DEXTROSE 360-4.14 MG/200ML-% IV SOLN
60.0000 mg/h | INTRAVENOUS | Status: DC
  Administered 2021-06-30 (×2): 60 mg/h via INTRAVENOUS
  Filled 2021-06-30 (×2): qty 200

## 2021-06-30 MED ORDER — FUROSEMIDE 10 MG/ML IJ SOLN
40.0000 mg | Freq: Two times a day (BID) | INTRAMUSCULAR | Status: DC
  Administered 2021-07-01 (×2): 40 mg via INTRAVENOUS
  Filled 2021-06-30 (×2): qty 4

## 2021-06-30 MED ORDER — LISINOPRIL 10 MG PO TABS: 10.0000 mg | ORAL_TABLET | Freq: Every day | ORAL | Status: DC

## 2021-06-30 MED ORDER — HEPARIN (PORCINE) 25000 UT/250ML-% IV SOLN
1050.0000 [IU]/h | INTRAVENOUS | Status: DC
  Administered 2021-06-30: 800 [IU]/h via INTRAVENOUS
  Administered 2021-07-01: 1050 [IU]/h via INTRAVENOUS
  Filled 2021-06-30 (×2): qty 250

## 2021-06-30 MED ORDER — ADENOSINE 6 MG/2ML IV SOLN
INTRAVENOUS | Status: AC
  Filled 2021-06-30: qty 4

## 2021-06-30 MED ORDER — HEPARIN BOLUS VIA INFUSION
3000.0000 [IU] | Freq: Once | INTRAVENOUS | Status: AC
  Administered 2021-06-30: 3000 [IU] via INTRAVENOUS
  Filled 2021-06-30: qty 3000

## 2021-06-30 MED ORDER — AMIODARONE HCL IN DEXTROSE 360-4.14 MG/200ML-% IV SOLN
30.0000 mg/h | INTRAVENOUS | Status: DC
  Administered 2021-07-01 (×2): 30 mg/h via INTRAVENOUS
  Filled 2021-06-30 (×2): qty 200

## 2021-06-30 MED ORDER — SPIRONOLACTONE 12.5 MG HALF TABLET
12.5000 mg | ORAL_TABLET | Freq: Every day | ORAL | Status: DC
  Administered 2021-07-01: 12.5 mg via ORAL
  Filled 2021-06-30: qty 1

## 2021-06-30 MED ORDER — LOSARTAN POTASSIUM 50 MG PO TABS: 25.0000 mg | ORAL_TABLET | Freq: Every day | ORAL | Status: DC

## 2021-06-30 MED ORDER — SPIRONOLACTONE 12.5 MG HALF TABLET: 12.5000 mg | ORAL_TABLET | Freq: Every day | ORAL | Status: DC

## 2021-06-30 MED ORDER — DIGOXIN 125 MCG PO TABS
0.1250 mg | ORAL_TABLET | Freq: Every day | ORAL | Status: DC
  Administered 2021-06-30 – 2021-07-01 (×2): 0.125 mg via ORAL
  Filled 2021-06-30 (×2): qty 1

## 2021-06-30 MED ORDER — LOSARTAN POTASSIUM 25 MG PO TABS
25.0000 mg | ORAL_TABLET | Freq: Every day | ORAL | Status: DC
  Administered 2021-06-30: 25 mg via ORAL
  Filled 2021-06-30: qty 1

## 2021-06-30 MED ORDER — AMIODARONE LOAD VIA INFUSION
150.0000 mg | Freq: Once | INTRAVENOUS | Status: AC
  Administered 2021-06-30: 150 mg via INTRAVENOUS
  Filled 2021-06-30: qty 83.34

## 2021-06-30 MED ORDER — ADENOSINE 6 MG/2ML IV SOLN
INTRAVENOUS | Status: AC
  Administered 2021-06-30: 6 mg via INTRAVENOUS
  Filled 2021-06-30: qty 4

## 2021-06-30 MED ORDER — FUROSEMIDE 10 MG/ML IJ SOLN
40.0000 mg | Freq: Once | INTRAMUSCULAR | Status: AC
  Administered 2021-06-30: 40 mg via INTRAVENOUS
  Filled 2021-06-30: qty 4

## 2021-06-30 NOTE — ED Notes (Signed)
Pt placed on continuous EKG, [place] and cardiology at the bedside alert and oriented

## 2021-06-30 NOTE — Progress Notes (Addendum)
ANTICOAGULATION CONSULT NOTE - Initial Consult  Pharmacy Consult for Heparin  Indication: atrial fibrillation  No Known Allergies  Patient Measurements: Height: 5\' 6"  (167.6 cm) Weight: 54.4 kg (120 lb) IBW/kg (Calculated) : 63.8 Heparin Dosing Weight: 54.4 kg (TBW)  Vital Signs: Temp: 98.6 F (37 C) (07/22 1607) Temp Source: Oral (07/22 1607) BP: 130/96 (07/22 1845) Pulse Rate: 125 (07/22 1845)  Labs: Recent Labs    06/30/21 1621  HGB 13.9  HCT 43.7  PLT 166  CREATININE 0.99  TROPONINIHS 46*    Estimated Creatinine Clearance: 88.5 mL/min (by C-G formula based on SCr of 0.99 mg/dL).   Medical History: Past Medical History:  Diagnosis Date   Congenital heart defect     Medications:  No AC PTA  Assessment: Pt presents to ED with new onset Aflutter and needs to be started on [facility]. H&H, PLT WNL. No s/sx of bleeding.  Goal of Therapy:  Heparin level 0.3-0.7 units/ml Monitor platelets by anticoagulation protocol: Yes   Plan:  Heparin 3000 units IV bolus followed by heparin gtt 800 units/hr.  HL 6 hours Monitor HL, CBC, s/sx bleeding.   F/u with long term plan for anticoagulation.   Thank you for allowing Pharmacy take part in this patient's care.  [facility], PharmD Candidate 06/30/2021 7:40 PM

## 2021-06-30 NOTE — ED Provider Notes (Signed)
[facility] EMERGENCY DEPARTMENT Provider Note   CSN: [id number] Arrival date & time: 06/30/21  1556     History Chief Complaint  Patient presents with   Edema   Atrial Fibrillation    Cole Hebert is a 25 y.o. male.   Atrial Fibrillation  25 year old male PMHx TOF s/p surgery 12/2020, presenting for bilateral upper and lower extremity swelling, noted to have afib today. Onset 3-4 days ago, preceded by binge-drinking episode, waxing/waning sx, no clear modifying factors. Assc sx include orthopnea, stomach tightness w/ sitting up, contispated (but still having small bm's). Usually does not drink this much. No history of similar.  On enalapril.  No further medical concern at this time, including fevers, chills, diaphoresis, sore throat, cough, rhinorrhea, palpitations, cp, nv, diarrhea, dysuria, hematuria, headaches, audiovisual.  Hx obtained from pt, so, chart review.    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 Authorizing Provider  enalapril (VASOTEC) 10 MG tablet Take 10 mg by mouth daily.   Yes [provider]  azithromycin (ZITHROMAX Z-PAK) 250 MG tablet As directed Patient not taking: Reported on 06/30/2021 09/08/17   Rolland Porter, MD  ciprofloxacin (CIPRO)  500 MG tablet Take 1 tablet (500 mg total) by mouth 2 (two) times daily. One po bid x 7 days Patient not taking: Reported on 06/30/2021 02/19/17   Geoffery Lyons, MD  guaiFENesin-codeine 100-10 MG/5ML syrup Take 5 mLs by mouth 3 (three) times daily as needed for cough. Patient not taking: No sig reported 11/30/15   Danelle Berry, PA-C  HYDROcodone-acetaminophen (NORCO) 5-325 MG tablet Take 1-2 tablets by mouth every 6 (six) hours as needed. Patient not taking: Reported on 06/30/2021 02/19/17   Geoffery Lyons, MD  ofloxacin (OCUFLOX) 0.3 % ophthalmic solution Place 1 drop into the right eye 4 (four) times daily. Patient not taking: Reported on 06/30/2021 01/30/18   Jacinto Halim, PA-C    Allergies    Patient has no known allergies.  Review of Systems   Review of Systems  All other systems reviewed and are negative.  Physical Exam Updated Vital Signs BP 121/87 (BP Location: Left Arm)   Pulse (!) 130   Temp 98.3 F (36.8 C) (Oral)   Resp 16   Ht 5\' 6"  (1.676 m)   Wt 54.4 kg   SpO2 100%   BMI 19.37 kg/m   Physical Exam Vitals and nursing note reviewed.  Constitutional:      General: He is not in acute distress.    Appearance: He is well-developed.  HENT:     Head: Normocephalic and atraumatic.  Eyes:     Extraocular Movements: Extraocular movements intact.  Conjunctiva/sclera: Conjunctivae normal.  Cardiovascular:     Rate and Rhythm: Regular rhythm. Tachycardia present.     Heart sounds: Murmur heard.  Pulmonary:     Effort: Pulmonary effort is normal. No respiratory distress.     Breath sounds: No stridor. No wheezing, rhonchi or rales.  Abdominal:     General: There is no distension.     Palpations: Abdomen is soft.     Tenderness: There is no abdominal tenderness. There is no guarding or rebound.  Musculoskeletal:     Cervical back: Normal range of motion and neck supple.     Right lower leg: Edema present.     Left lower leg: Edema present.  Skin:    General:  Skin is warm and dry.  Neurological:     Mental Status: He is alert and oriented to person, place, and time. Mental status is at baseline.  Psychiatric:        Mood and Affect: Mood normal.        Behavior: Behavior normal.    ED Results / Procedures / Treatments   Labs (all labs ordered are listed, but only abnormal results are displayed) Labs Reviewed  BASIC METABOLIC PANEL - Abnormal; Notable for the following components:      Result Value   Glucose, Bld 125 (*)    All other components within normal limits  CBC - Abnormal; Notable for the following components:   WBC 3.8 (*)    RBC 3.92 (*)    MCV 111.5 (*)    MCH 35.5 (*)    All other components within normal limits  BRAIN NATRIURETIC PEPTIDE - Abnormal; Notable for the following components:   B Natriuretic Peptide 1,021.5 (*)    All other components within normal limits  TROPONIN I (HIGH SENSITIVITY) - Abnormal; Notable for the following components:   Troponin I (High Sensitivity) 46 (*)    All other components within normal limits  TROPONIN I (HIGH SENSITIVITY) - Abnormal; Notable for the following components:   Troponin I (High Sensitivity) 38 (*)    All other components within normal limits  SARS CORONAVIRUS 2 (TAT 6-24 HRS)  HEPARIN LEVEL (UNFRACTIONATED)  CBC  HIV ANTIBODY (ROUTINE TESTING W REFLEX)  TSH  MAGNESIUM  BASIC METABOLIC PANEL    EKG EKG Interpretation  Date/Time:  Friday June 30 2021 18:41:54 EDT Ventricular Rate:  131 PR Interval:  91 QRS Duration: 142 QT Interval:  318 QTC Calculation: 469 R Axis:   -28 Text Interpretation: Atrial fibrillation with rapid ventricular response Right bundle branch block Inferior infarct , age undetermined Abnormal ECG When compared with ECG of EARLIER SAME DATE No significant change was found Confirmed by Dione Booze ([id number]) on 06/30/2021 10:04:51 PM  Radiology DG Chest 2 View  Result Date: 06/30/2021 CLINICAL DATA:  TACHYCARDIA; edema atrial fibrillation  EXAM: CHEST - 2 VIEW COMPARISON:  July 12, 2018 FINDINGS: The cardiomediastinal silhouette is enlarged in contour. Cardiomegaly appears increased in comparison to prior. Status post median sternotomy. No pleural effusion. No pneumothorax. No acute pleuroparenchymal abnormality. Visualized abdomen is unremarkable. No acute osseous abnormality. IMPRESSION: Increased size of the cardiomediastinal silhouette in comparison to prior. While this may be partially projectional in etiology, other differential considerations include increased cardiomegaly or a pericardial effusion. Recommend correlation with echocardiogram. Electronically Signed   By: Meda Klinefelter MD   On: 06/30/2021 16:38    Procedures Procedures   Medications Ordered in ED Medications  amiodarone (NEXTERONE) 1.8 mg/mL load via infusion  150 mg (150 mg Intravenous Bolus from Bag 06/30/21 1923)    Followed by  amiodarone (NEXTERONE PREMIX) 360-4.14 MG/200ML-% (1.8 mg/mL) IV infusion (60 mg/hr Intravenous New Bag/Given 06/30/21 2324)    Followed by  amiodarone (NEXTERONE PREMIX) 360-4.14 MG/200ML-% (1.8 mg/mL) IV infusion (has no administration in time range)  digoxin (LANOXIN) tablet 0.125 mg (0.125 mg Oral Given 06/30/21 2048)  heparin ADULT infusion 100 units/mL (25000 units/254mL) (800 Units/hr Intravenous New Bag/Given 06/30/21 2052)  furosemide (LASIX) injection 40 mg (has no administration in time range)  spironolactone (ALDACTONE) tablet 12.5 mg (has no administration in time range)  losartan (COZAAR) tablet 25 mg (25 mg Oral Given 06/30/21 2048)  acetaminophen (TYLENOL) tablet 650 mg (has no administration in time range)    Or  acetaminophen (TYLENOL) suppository 650 mg (has no administration in time range)  furosemide (LASIX) injection 40 mg (40 mg Intravenous Given 06/30/21 1922)  adenosine (ADENOCARD) 6 MG/2ML injection 6 mg ( Intravenous Canceled Entry 06/30/21 1922)  heparin bolus via infusion 3,000 Units (3,000 Units  Intravenous Bolus from Bag 06/30/21 2048)    ED Course  I have reviewed the triage vital signs and the nursing notes.  Pertinent labs & imaging results that were available during my care of the patient were reviewed by me and considered in my medical decision making (see chart for details).    MDM Rules/Calculators/A&P                          This is a 25 year old male PMHx TOF s/p multiple surgeries most recently 12/2020, presenting for extremity edema waxing and waning over the last 2 to 3 days after binge drinking episode.  He states that he does not normally drink that much, no prior history of withdrawal.  Tachycardic to the 140s.  HDS, AF, scant BLE pitting edema.  Initial interventions: attempted vagal maneuvers w/ transient slowing of heart rate, but resumption of tachycardia after few seconds.  Cardiology consulted, provided adenosine with some improvement of heart rate.  All studies independently reviewed by myself, d/w the attending physician, factored into my MDM. -EKG: Tachycardia 124 bpm, uncertain rhythm, RBBB appearing QRS, nonspecific ST/T changes, change compared to prior from 07/2018 -Repeat EKGs after administration of adenosine showed atrial flutter pattern with 2:1 conduction in the 140s -CXR: Increase cardiomediastinal silhouette with compared to prior, no pleural effusion or PTX -Troponin: 46->38 -BNP 1021.5 -Bedside echo performed by cardiology: EF less than 30% with global hypokinesis -Unremarkable: BMP, CBCd  Presentation appears most consistent with acute decompensated heart failure, atrial flutter, in context of tetralogy of Fallot s/p multiple procedures.  Minimal troponin leak, likely carditis or ACS.  Saturating well on room air, no cough, or significant risk factors for PE.  He is HDS and afebrile without systemic infectious symptoms.  CXR unremarkable for PNA, PTX, effusion, pneumoperitoneum.  No FND, tearing or posteriorly radiating chest pain.  Attempted  to transfer patient to [facility] and [facility] due to availability for [facility]-specializing cardiologists.  [facility] was completely full, and [facility] this point except under Dr. Mindi Curling, but will not be able to bring the patient in for the next 2 to 3 days.  However, they did add the patient to their waiting list.  Of note, patient seen by Dr. Mayford Knife at [facility].  Discussed with cardiology service, recommend amiodarone drip initiation for atrial flutter and digoxin for this and his heart failure.  Recommend admission to medicine for further management while awaiting  transfer.  Please refer to their documentation for further details.  Discussed with hospitalist service who agreed admit the patient.  Plan was discussed with patient who understands and agrees.  Patient HDS, although persistently tachycardic on reevaluation.  Subsequently admitted.  Final Clinical Impression(s) / ED Diagnoses Final diagnoses:  Atrial flutter with rapid ventricular response (HCC)  Edema, unspecified type  History of tetralogy of Fallot    Rx / DC Orders ED Discharge Orders     None        Colvin Carolihandrasekar, Laylonie Marzec, MD 07/01/21 0041    Cathren LaineSteinl, Kevin, MD 07/03/21 [phone]

## 2021-06-30 NOTE — Consult Note (Addendum)
Hebert Consultation:  Patient ID: Cole Hebert MRN: [id number]; DOB: 28-Jun-1996  Admit date: 06/30/2021 Date of Consult: 06/30/2021  Primary Care Provider: Patient, No Pcp Per (Inactive) Primary Cardiologist: None  Primary Electrophysiologist:  None   Patient Profile:  Cole Hebert is a 25 y.o. 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  History of Present Illness:  [name] presents with 1 week of symptoms of bilateral lower extremity edema as well as intermittent shortness of breath.  He reports when he lays down he does get short of breath which is consistent with orthopnea.  He reports no prior history of congestive heart failure.  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  He is status post BT shunt as well as full tetralogy repair in 1999.  He is undergone 2 pulmonary valve (conduit) replacements with most recent 1 in January 2021.  Detailed notes from his pediatric cardiologist are summarized below.  He presented to the emergency room and was found to be in atrial flutter with RVR.  Heart rate 131 bpm.  We did give him adenosine at the bedside which did slow his heart rate down and reveal classic flutter waves with sawtooth pattern.  He does have some lower extremity edema however lungs are remarkably clear.  Chest x-ray mentions possibly enlargement of his cardiac silhouette which I suspect is related to his tetralogy.  He denies any chest pain.  He does report marijuana as well as alcohol use.  I do wonder if this has worsened his symptoms.  I performed a bedside echocardiogram that shows severely reduced LV function 25-30%.  RV is severely dilated with moderately reduced function.  He  appears volume overloaded.  Remains in rapid atrial flutter.  Labs notable for BNP 1021.  High sensitive troponin 46.  Creatinine 0.99.  His hemoglobin is 13.9 platelets 166.   Problem List Cole Hebert  1.  Tetralogy of Fallot, pulmonary atresia - s/p BT shunt (8 Jun 98) - s/p TOF repair - patch closure VSD, 14 mm RV-PA conduit (17 Jun 99) - s/p VA ECMO on (21 Jun 99) - s/p replacement of pulmonary valve (24 mm conduit), closure of residual VSD (18 Jun 07) - s/p replacement of pulmonary valve (28 mm pulmonary homograft) [[place] 12/22/19]  2.  Well functioning pulmonary valve and conduit               - peak gradient ~ 24 mm Hg             - RVSP at 1/2 systemic, mild RVH 3.  trivial to mild pulmonary regurgitation             - mild RV enlargement 4.  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  History of arrhythmia/cardiac arrest, VA ECMO, fungal and bacterial sepsis and presumed endocarditis post-op in 1999   7. Moderate dilation of the aortic root and moderate dilation of  the ascending aorta.   8.  Developmental difficulties  Heart Pathway Score:       Past Medical History: Past Medical History:  Diagnosis Date   Congenital heart defect     Past Surgical History: Past Surgical History:  Procedure Laterality Date   CARDIAC SURGERY       Home Medications:  Prior to Admission medications   Medication Sig Start Date End Date Taking? Authorizing Provider  azithromycin (ZITHROMAX Z-PAK) 250 MG tablet As directed 09/08/17   Rolland PorterJames, Mark, MD  ciprofloxacin (CIPRO) 500 MG tablet Take 1 tablet (500 mg total) by mouth 2 (two) times daily. One po bid x 7 days 02/19/17   Geoffery Lyonselo, Douglas, MD  enalapril (VASOTEC) 10 MG tablet Take 10 mg by mouth daily.    [provider]   guaiFENesin-codeine 100-10 MG/5ML syrup Take 5 mLs by mouth 3 (three) times daily as needed for cough. Patient not taking: Reported on 02/19/2017 11/30/15   Danelle Berryapia, Leisa, PA-C  HYDROcodone-acetaminophen (NORCO) 5-325 MG tablet Take 1-2 tablets by mouth every 6 (six) hours as needed. 02/19/17   Geoffery Lyonselo, Douglas, MD  ofloxacin (OCUFLOX) 0.3 % ophthalmic solution Place 1 drop into the right eye 4 (four) times daily. 01/30/18   Maczis, Elmer SowMichael M, PA-C    Inpatient Medications: Scheduled Meds:  adenosine       adenosine       Continuous Infusions:  PRN Meds:   Allergies:    No Known Allergies  Social History:   Social History   Socioeconomic History   Marital status: Single    Spouse name: Not on file   Number of children: Not on file   Years of education: Not on file   Highest education level: Not on file  Occupational History   Not on file  Tobacco Use   Smoking status: Never   Smokeless tobacco: Never  Substance and Sexual Activity   Alcohol use: Yes   Drug use: Yes    Types: Marijuana    Comment: every other day   Sexual activity: Yes    Birth control/protection: None  Other Topics Concern   Not on file  Social History Narrative   Not on file   Social Determinants of Health   Financial Resource Strain: Not on file  Food Insecurity: Not on file  Transportation Needs: Not on file  Physical Activity: Not on file  Stress: Not on file  Social Connections: Not on file  Intimate Partner Violence: Not on file     Family History:   History reviewed. No pertinent family history.   ROS:  All other ROS reviewed and negative. Pertinent positives noted in the HPI.     Physical Exam/Data:   Vitals:   06/30/21 1607 06/30/21 1609 06/30/21 1745  BP: (!) 144/107  132/90  Pulse: (!) 140  (!) 143  Resp: 18  (!) 26  Temp: 98.6 F (37 C)    TempSrc: Oral    SpO2: 100%  97%  Weight:  54.4 kg   Height:  5\' 6"  (1.676 m)    No intake or output data in the 24 hours ending  06/30/21 1835  Last 3 Weights 06/30/2021 07/11/2019 10/23/2018  Weight (lbs) 120 lb 110 lb 110 lb  Weight (kg) 54.432 kg 49.896 kg 49.896 kg    Body mass index is 19.37 kg/m.   General: Well nourished, well developed, in no acute distress Head: Atraumatic, normal size  Eyes: PEERLA, EOMI  Neck: Supple, JVD 8 to  10 cm of water Endocrine: No thryomegaly Cardiac: Normal S1, S2; tachycardia noted, irregular rhythm Lungs: Crackles at the lung bases Abd: Soft, nontender, no hepatomegaly  Ext: 2+ pitting edema Musculoskeletal: No deformities, BUE and BLE strength normal and equal Skin: Warm and dry, no rashes   Neuro: Alert and oriented to person, place, time, and situation, CNII-XII grossly intact, no focal deficits  Psych: Normal mood and affect   EKG:  The EKG was personally reviewed and demonstrates: Atrial flutter heart rate 131, right bundle branch block Telemetry:  Telemetry was personally reviewed and demonstrates: Atrial flutter in the 130s  Relevant CV Studies: TTE 02/01/2020 Normal left ventricular function, 56%.  Mildly decreased right ventricular systolic function, mild aortic insufficiency, mild aortic root dilation, pulmonary valve conduit with 26 mm peak gradient mild AI  Laboratory Data: High Sensitivity Troponin:   Recent Labs  Lab 06/30/21 1621  TROPONINIHS 46*     Cardiac EnzymesNo results for input(s): TROPONINI in the last 168 hours. No results for input(s): TROPIPOC in the last 168 hours.  Chemistry Recent Labs  Lab 06/30/21 1621  NA 140  K 4.0  CL 107  CO2 28  GLUCOSE 125*  BUN 11  CREATININE 0.99  CALCIUM 8.9  GFRNONAA >60  ANIONGAP 5    No results for input(s): PROT, ALBUMIN, AST, ALT, ALKPHOS, BILITOT in the last 168 hours. Hematology Recent Labs  Lab 06/30/21 1621  WBC 3.8*  RBC 3.92*  HGB 13.9  HCT 43.7  MCV 111.5*  MCH 35.5*  MCHC 31.8  RDW 12.7  PLT 166   BNP Recent Labs  Lab 06/30/21 1621  BNP 1,021.5*    DDimer No results  for input(s): DDIMER in the last 168 hours.  Radiology/Studies:  DG Chest 2 View  Result Date: 06/30/2021 CLINICAL DATA:  TACHYCARDIA; edema atrial fibrillation EXAM: CHEST - 2 VIEW COMPARISON:  July 12, 2018 FINDINGS: The cardiomediastinal silhouette is enlarged in contour. Cardiomegaly appears increased in comparison to prior. Status post median sternotomy. No pleural effusion. No pneumothorax. No acute pleuroparenchymal abnormality. Visualized abdomen is unremarkable. No acute osseous abnormality. IMPRESSION: Increased size of the cardiomediastinal silhouette in comparison to prior. While this may be partially projectional in etiology, other differential considerations include increased cardiomegaly or a pericardial effusion. Recommend correlation with echocardiogram. Electronically Signed   By: Meda Klinefelter MD   On: 06/30/2021 16:38    Assessment and Plan:   Atrial flutter -He presents with progressive symptoms of shortness of breath as well as lower extremity edema.  He is in clinical heart failure.  Bedside echocardiogram performed by me shows systolic dysfunction which is new with an EF of 5-30% with global hypokinesis.  He has postoperative Supple movement.  RV is severely dilated with moderately reduced function. -Blood pressure stable. -He is hemodynamically stable. -We did give adenosine in the emergency room.  This clearly shows flutter waves.  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  QRS 142 ms. -For now would recommend he start amiodarone drip.  We will also give him digoxin 0.125 mg to help slow him down.  Would avoid beta-blockers and calcium channel blockers in the setting of significant systolic dysfunction. -We will start heparin drip as he will likely need cardioversion versus an ablation procedure.  2.  Acute biventricular dysfunction, decompensated heart failure -EF in February last year 56% at [facility].  He had mildly reduced  RV function.  Has had 2 pulmonary conduits. -  He now presents with atrial flutter which is likely thrown him into biventricular failure.  He is too young for coronary disease. -For now we will control his rate and initiate digoxin therapy to see if we can help slow him down without making him unstable. -We will start with Lasix 40 mg IV twice daily. -We will start him on losartan 25 mg daily tonight. Consider entresto, but was on enalapril at home and will need this to wash out for 36 hours. Add Aldactone tomorrow. -We will obtain an echocardiogram tomorrow.  Need to get a good look at the pulmonary valve conduit.  However his biventricular failure is likely arrhythmia related. -I had a long discussion with the patient and his family.  They have requested transfer to [facility] where he receives all of his congenital heart disease care.  I think this is reasonable.  We have initiated transfer in the emergency room.  It appears he will not have a bed for up to 2 days.  We will start with the above treatment.  He will need evaluation by an electrophysiologist who specializes in congenital heart disease.   3.  Tetralogy of Fallot/status postrepair/status post pulmonary valve conduit (most recent procedure January 2021) -Refer to full notes available in care everywhere.  His congenital heart disease course has been delineated. -Ple[facility] insure transfer to [facility] has been initiated. This was discussed with ER staff. In interim, would recommend admission to the hospitalist service.  I think this is best as he will likely need congenital heart disease specialist as well as an adult congenital heart disease specialist with electrophysiology experience. -In the interim we will obtain an echocardiogram to look at his heart in more closely detail as well as for hemodynamic assessment of the pulmonary conduit. -Of note he has had prior endocarditis and needs SBE prophylaxis for  procedures.  For questions or updates, please contact [facility] Please consult [email or web address] for contact info under   Signed, Gerri Spore T. Flora Lipps, MD, [facility] [place]  [facility]  06/30/2021 6:35 PM

## 2021-06-30 NOTE — ED Provider Notes (Signed)
Emergency Medicine Provider Triage Evaluation Note  Cole Hebert , a 25 y.o. male  was evaluated in triage.  Pt complains of bilateral hand and feet swelling.  Patient has history Tetralogy of Fallot, recent cardiac surgery in January.  His heart rate is in triage.  Denies any pain to his extremities no recent trauma.  Review of Systems  Positive: Bilateral hand swelling, tachycardia Negative: Chest pain, shortness of breath  Physical Exam  BP (!) 144/107   Pulse (!) 140   Temp 98.6 F (37 C) (Oral)   Resp 18   Ht 5\' 6"  (1.676 m)   Wt 54.4 kg   SpO2 100%   BMI 19.37 kg/m  Gen:   Awake, no distress   Resp:  Normal effort  MSK:   Moves extremities without difficulty  Other:  Pedal edema, tachycardia  Medical Decision Making  Medically screening exam initiated at 4:17 PM.  Appropriate orders placed.  Cole Hebert was informed that the remainder of the evaluation will be completed by another provider, this initial triage assessment does not replace that evaluation, and the importance of remaining in the ED until their evaluation is complete.     Katrinka Blazing, PA-C 06/30/21 1620    07/02/21, MD 07/10/21 [phone]

## 2021-06-30 NOTE — ED Triage Notes (Signed)
Pt arrives POV for eval of blt hand and feet swelling w/ orthopnea. Pt reports swelling has been intermittent. Pt w/ hx of tetralogy of fallot as an infant, takes enalapril. Reports last cardiac surgery in January. Denies any edema prior to this incident.

## 2021-07-01 ENCOUNTER — Other Ambulatory Visit (HOSPITAL_COMMUNITY): Payer: Medicaid Other

## 2021-07-01 ENCOUNTER — Other Ambulatory Visit: Payer: Self-pay

## 2021-07-01 DIAGNOSIS — I509 Heart failure, unspecified: Secondary | ICD-10-CM

## 2021-07-01 DIAGNOSIS — I4892 Unspecified atrial flutter: Principal | ICD-10-CM

## 2021-07-01 LAB — CBC
HCT: 41.7 % (ref 39.0–52.0)
Hemoglobin: 13.9 g/dL (ref 13.0–17.0)
MCH: 35.4 pg — ABNORMAL HIGH (ref 26.0–34.0)
MCHC: 33.3 g/dL (ref 30.0–36.0)
MCV: 106.1 fL — ABNORMAL HIGH (ref 80.0–100.0)
Platelets: 160 10*3/uL (ref 150–400)
RBC: 3.93 MIL/uL — ABNORMAL LOW (ref 4.22–5.81)
RDW: 12.4 % (ref 11.5–15.5)
WBC: 5.1 10*3/uL (ref 4.0–10.5)
nRBC: 0 % (ref 0.0–0.2)

## 2021-07-01 LAB — TSH: TSH: 3.11 u[IU]/mL (ref 0.350–4.500)

## 2021-07-01 LAB — BASIC METABOLIC PANEL
Anion gap: 12 (ref 5–15)
BUN: 11 mg/dL (ref 6–20)
CO2: 22 mmol/L (ref 22–32)
Calcium: 8.8 mg/dL — ABNORMAL LOW (ref 8.9–10.3)
Chloride: 106 mmol/L (ref 98–111)
Creatinine, Ser: 1.02 mg/dL (ref 0.61–1.24)
GFR, Estimated: >60 mL/min (ref >60)
Glucose, Bld: 107 mg/dL — ABNORMAL HIGH (ref 70–99)
Potassium: 3.9 mmol/L (ref 3.5–5.1)
Sodium: 140 mmol/L (ref 135–145)

## 2021-07-01 LAB — MAGNESIUM: Magnesium: 1.6 mg/dL — ABNORMAL LOW (ref 1.7–2.4)

## 2021-07-01 LAB — HEPARIN LEVEL (UNFRACTIONATED)
Heparin Unfractionated: 0.12 IU/mL — ABNORMAL LOW (ref 0.30–0.70)
Heparin Unfractionated: 0.25 IU/mL — ABNORMAL LOW (ref 0.30–0.70)
Heparin Unfractionated: 0.49 IU/mL (ref 0.30–0.70)

## 2021-07-01 LAB — SARS CORONAVIRUS 2 (TAT 6-24 HRS): SARS Coronavirus 2: NEGATIVE

## 2021-07-01 LAB — HIV ANTIBODY (ROUTINE TESTING W REFLEX): HIV Screen 4th Generation wRfx: NONREACTIVE

## 2021-07-01 MED ORDER — POLYETHYLENE GLYCOL 3350 17 G PO PACK
17.0000 g | PACK | Freq: Every day | ORAL | Status: DC
  Filled 2021-07-01: qty 1

## 2021-07-01 MED ORDER — LOSARTAN POTASSIUM 25 MG PO TABS
12.5000 mg | ORAL_TABLET | Freq: Every day | ORAL | Status: DC
  Administered 2021-07-01 – 2021-07-02 (×2): 12.5 mg via ORAL
  Filled 2021-07-01 (×2): qty 1

## 2021-07-01 MED ORDER — ALUM & MAG HYDROXIDE-SIMETH 200-200-20 MG/5ML PO SUSP
15.0000 mL | ORAL | Status: DC | PRN
  Administered 2021-07-02: 15 mL via ORAL
  Filled 2021-07-01: qty 30

## 2021-07-01 MED ORDER — MAGNESIUM SULFATE 2 GM/50ML IV SOLN
2.0000 g | Freq: Once | INTRAVENOUS | Status: AC
  Administered 2021-07-01: 2 g via INTRAVENOUS
  Filled 2021-07-01: qty 50

## 2021-07-01 MED ORDER — ACETAMINOPHEN 650 MG RE SUPP: 650.0000 mg | Freq: Four times a day (QID) | RECTAL | Status: DC | PRN

## 2021-07-01 MED ORDER — SODIUM CHLORIDE 0.9 % IV SOLN
25.0000 mg | Freq: Once | INTRAVENOUS | Status: AC | PRN
  Administered 2021-07-01: 25 mg via INTRAVENOUS
  Filled 2021-07-01: qty 1

## 2021-07-01 MED ORDER — ACETAMINOPHEN 325 MG PO TABS: 650.0000 mg | ORAL_TABLET | Freq: Four times a day (QID) | ORAL | Status: DC | PRN

## 2021-07-01 NOTE — Progress Notes (Signed)
   07/01/21 0000  Assess: MEWS Score  Temp 98.3 F (36.8 C)  BP 121/87  Pulse Rate (!) 130  ECG Heart Rate (!) 132  Resp 16  Level of Consciousness Alert  SpO2 100 %  O2 Device Room Air  Assess: MEWS Score  MEWS Temp 0  MEWS Systolic 0  MEWS Pulse 3  MEWS RR 0  MEWS LOC 0  MEWS Score 3  MEWS Score Color Yellow  Assess: if the MEWS score is Yellow or Red  Were vital signs taken at a resting state? Yes  Focused Assessment No change from prior assessment  Early Detection of Sepsis Score *See Row Information* Low  MEWS guidelines implemented *See Row Information* Yes  Treat  Pain Scale 0-10  Pain Score 0  Patients Stated Pain Goal 0  Take Vital Signs  Increase Vital Sign Frequency  Yellow: Q 2hr X 2 then Q 4hr X 2, if remains yellow, continue Q 4hrs  Escalate  MEWS: Escalate Yellow: discuss with charge nurse/RN and consider discussing with provider and RRT  Notify: Charge Nurse/RN  Name of Charge Nurse/RN Notified Heather P RN  Date Charge Nurse/RN Notified 07/01/21  Time Charge Nurse/RN Notified 0000  Document  Patient Outcome Stabilized after interventions  Progress note created (see row info) Yes

## 2021-07-01 NOTE — Progress Notes (Signed)
PROGRESS NOTE  Cole Hebert JQG:[id number] DOB: 17-Apr-1996 DOA: 06/30/2021 PCP: Patient, No Pcp Per (Inactive)  HPI/Recap of past 24 hours: Cole Hebert is a 25 y.o. 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  Denies any chest pain productive cough or fever or chills. In the ER patient was found to be in atrial flutter with RVR. Chest x-ray showed increased cardiomegaly.  BNP was 1021 and high sensitive troponin was 46 and 38.  On-call cardiologist was consulted and had a bedside 2D echo done which showed features concerning for biventricular failure.  Patient was started on amiodarone drip and digoxin with heparin infusion for the atrial flutter and IV Lasix for the CHF.  Patient admitted for further management. Plan is to transfer patient to [facility] where patient follows with his adult congenital heart disease cardiologist once bed available. Patient is on the waiting list.    Today, patient denies any worsening shortness of breath, denies any chest pain, is feeling constipated.  Awaiting transfer to [facility].   Assessment/Plan: Principal Problem:   Acute CHF (congestive heart failure) (HCC) Active Problems:   Atrial flutter with rapid ventricular response (HCC)   TOF (tetralogy of Fallot)   Atrial flutter with RVR Rate with better control TSH 3.11 Cardiology consulted, continue amiodarone drip, stopped digoxin as patient may have converted to sinus rhythm with first-degree AV block Continue heparin drip  Acute biventricular HF BNP 1021 Echo pending Continue IV Lasix Started on losartan 25 mg which was reduced to 12.5 due to soft BP Hold home enalapril prior to starting possible Entresto, hold Aldactone  Tetralogy of  Fallot Status postrepair/pulmonary valve conduit most recent done in January 2021 Awaiting transfer to [facility] to the congenital heart disease team      Estimated body mass index is 19.31 kg/m as calculated from the following:   Height as of this encounter: 5\' 4"  (1.626 m).   Weight as of this encounter: 51 kg.     Code Status: Full  Family Communication: Girlfriend at bedside  Disposition Plan: Status is: Inpatient  Remains inpatient appropriate because:Inpatient level of care appropriate due to severity of illness  Dispo: The patient is from: Home              Anticipated d/c is to: Home              Patient currently is not medically stable to d/c.   Difficult to place patient No    Consultants: Cardiology  Procedures: None  Antimicrobials: None  DVT prophylaxis: Heparin drip   Objective: Vitals:   07/01/21 0612 07/01/21 0830 07/01/21 0849 07/01/21 1123  BP: (!) 86/57 100/66  94/63  Pulse:  91 87 90  Resp: 18 18  18   Temp: 98.5 F (36.9 C) 98.2 F (36.8 C)  97.7 F (36.5 C)  TempSrc: Oral Oral  Oral  SpO2: 98% 99%  100%  Weight:      Height:        Intake/Output Summary (Last 24 hours) at 07/01/2021 1437 Last data filed at 07/01/2021 0947 Gross per 24 hour  Intake 942.89 ml  Output 2100 ml  Net -1157.11 ml   Filed Weights   06/30/21 1609 07/01/21 0221  Weight: 54.4 kg 51  kg    Exam: General: NAD, chronically ill-appearing Cardiovascular: S1, S2 present Respiratory: Bibasilar crackles Abdomen: Soft, nontender, nondistended, bowel sounds present Musculoskeletal: No bilateral pedal edema noted Skin: Normal Psychiatry: Normal mood     Data Reviewed: CBC: Recent Labs  Lab 06/30/21 1621 07/01/21 0646  WBC 3.8* 5.1  HGB 13.9 13.9  HCT 43.7 41.7  MCV 111.5* 106.1*  PLT 166 160   Basic Metabolic Panel: Recent Labs  Lab 06/30/21 1621 07/01/21 0646  NA 140 140  K 4.0 3.9  CL 107 106  CO2 28 22  GLUCOSE 125* 107*  BUN 11  11  CREATININE 0.99 1.02  CALCIUM 8.9 8.8*  MG  --  1.6*   GFR: Estimated Creatinine Clearance: 80.6 mL/min (by C-G formula based on SCr of 1.02 mg/dL). Liver Function Tests: No results for input(s): AST, ALT, ALKPHOS, BILITOT, PROT, ALBUMIN in the last 168 hours. No results for input(s): LIPASE, AMYLASE in the last 168 hours. No results for input(s): AMMONIA in the last 168 hours. Coagulation Profile: No results for input(s): INR, PROTIME in the last 168 hours. Cardiac Enzymes: No results for input(s): CKTOTAL, CKMB, CKMBINDEX, TROPONINI in the last 168 hours. BNP (last 3 results) No results for input(s): PROBNP in the last 8760 hours. HbA1C: No results for input(s): HGBA1C in the last 72 hours. CBG: No results for input(s): GLUCAP in the last 168 hours. Lipid Profile: No results for input(s): CHOL, HDL, LDLCALC, TRIG, CHOLHDL, LDLDIRECT in the last 72 hours. Thyroid Function Tests: Recent Labs    07/01/21 0646  TSH 3.110   Anemia Panel: No results for input(s): VITAMINB12, FOLATE, FERRITIN, TIBC, IRON, RETICCTPCT in the last 72 hours. Urine analysis:    Component Value Date/Time   COLORURINE YELLOW 03/09/2020 1159   APPEARANCEUR CLOUDY (A) 03/09/2020 1159   LABSPEC 1.015 03/09/2020 1159   PHURINE 6.0 03/09/2020 1159   GLUCOSEU NEGATIVE 03/09/2020 1159   HGBUR SMALL (A) 03/09/2020 1159   BILIRUBINUR NEGATIVE 03/09/2020 1159   KETONESUR NEGATIVE 03/09/2020 1159   PROTEINUR NEGATIVE 03/09/2020 1159   UROBILINOGEN 2.0 (H) 10/21/2013 1749   NITRITE NEGATIVE 03/09/2020 1159   LEUKOCYTESUR LARGE (A) 03/09/2020 1159   Sepsis Labs: @LABRCNTIP (procalcitonin:4,lacticidven:4)  ) Recent Results (from the past 240 hour(s))  SARS CORONAVIRUS 2 (TAT 6-24 HRS) Nasopharyngeal Nasopharyngeal Swab     Status: None   Collection Time: 07/01/21 12:39 AM   Specimen: Nasopharyngeal Swab  Result Value Ref Range Status   SARS Coronavirus 2 NEGATIVE NEGATIVE Final    Comment:  (NOTE) SARS-CoV-2 target nucleic acids are NOT DETECTED.  The SARS-CoV-2 RNA is generally detectable in upper and lower respiratory specimens during the acute phase of infection. Negative results do not preclude SARS-CoV-2 infection, do not rule out co-infections with other pathogens, and should not be used as the sole basis for treatment or other patient management decisions. Negative results must be combined with clinical observations, patient history, and epidemiological information. The expected result is Negative.  Fact Sheet for Patients: 07/03/21  Fact Sheet for Healthcare Providers: HairSlick.no  This test is not yet approved or cleared by the quierodirigir.com FDA and  has been authorized for detection and/or diagnosis of SARS-CoV-2 by FDA under an Emergency Use Authorization (EUA). This EUA will remain  in effect (meaning this test can be used) for the duration of the COVID-19 declaration under Se ction 564(b)(1) of the Act, 21 U.S.C. section 360bbb-3(b)(1), unless the authorization is terminated or revoked sooner.  Performed at [facility], [address] [place], [place] [address]       Studies: DG Chest 2 View  Result Date: 06/30/2021 CLINICAL DATA:  TACHYCARDIA; edema atrial fibrillation EXAM: CHEST - 2 VIEW COMPARISON:  July 12, 2018 FINDINGS: The cardiomediastinal silhouette is enlarged in contour. Cardiomegaly appears increased in comparison to prior. Status post median sternotomy. No pleural effusion. No pneumothorax. No acute pleuroparenchymal abnormality. Visualized abdomen is unremarkable. No acute osseous abnormality. IMPRESSION: Increased size of the cardiomediastinal silhouette in comparison to prior. While this may be partially projectional in etiology, other differential considerations include increased cardiomegaly or a pericardial effusion. Recommend correlation with echocardiogram.  Electronically Signed   By: Meda Klinefelter MD   On: 06/30/2021 16:38    Scheduled Meds:  furosemide  40 mg Intravenous BID   losartan  12.5 mg Oral Daily   polyethylene glycol  17 g Oral Daily    Continuous Infusions:  amiodarone 30 mg/hr (07/01/21 0947)   heparin 950 Units/hr (07/01/21 0841)     LOS: 1 day     Briant Cedar, MD [facility]  If 7PM-7AM, please contact night-coverage [email or web address] 07/01/2021, 2:37 PM

## 2021-07-01 NOTE — H&P (Signed)
History and Physical    Cole Hebert LXB:[id number] DOB: 1996/01/22 DOA: 06/30/2021  PCP: Patient, No Pcp Per (Inactive)  Patient coming from: Home.  Chief Complaint: Shortness of breath.  HPI: Terel Bann is a 25 y.o. male with history of tetralogy of Fallot status postrepair pulmonary valve replacement with conduit x2 most recently was in January 2021 presents to the ER because of increasing shortness of breath orthopnea and lower extremity edema which patient has been experiencing for the last 3 to 4 days.  Denies any chest pain productive cough or fever or chills.  ED Course: In the ER patient was found to be in atrial flutter with RVR.  Which became more obvious after adenosine was given.  Chest x-ray showed increased cardiomegaly.  BNP was 1021 and high sensitive troponin was 46 and 38.  On-call cardiologist was consulted and had a bedside 2D echo done which showed features concerning for biventricular failure.  Patient was started on amiodarone drip and digoxin with heparin infusion for the atrial flutter and IV Lasix for the CHF.  Patient admitted for further management.  COVID test is pending.  Plan is eventually to transfer patient to [facility] where patient follows with his adult congenital heart disease cardiologist.  Patient in the waiting list.  Review of Systems: As per HPI, rest all negative.   Past Medical History:  Diagnosis Date   Congenital heart defect     Past Surgical History:  Procedure Laterality Date   CARDIAC SURGERY       reports that he has never smoked. He has never used smokeless tobacco. He reports current alcohol use. He reports current drug use. Drug: Marijuana.  No Known Allergies  Family History  Problem Relation Age of Onset   Diabetes type II Mother     Prior to Admission medications   Medication Sig Start Date End Date Taking? Authorizing Provider  enalapril (VASOTEC) 10 MG tablet Take 10 mg by mouth daily.   Yes [provider]  azithromycin (ZITHROMAX Z-PAK) 250 MG tablet As directed Patient not taking: Reported on 06/30/2021 09/08/17   Rolland Porter, MD  ciprofloxacin (CIPRO) 500 MG tablet Take 1 tablet (500 mg total) by mouth 2 (two) times daily. One po bid x 7 days Patient not taking: Reported on 06/30/2021 02/19/17   Geoffery Lyons, MD  guaiFENesin-codeine 100-10 MG/5ML syrup Take 5 mLs by mouth 3 (three) times daily as needed for cough. Patient not taking: No sig reported 11/30/15   Danelle Berry, PA-C  HYDROcodone-acetaminophen (NORCO) 5-325 MG tablet Take 1-2 tablets by mouth every 6 (six) hours as needed. Patient not taking: Reported on 06/30/2021 02/19/17   Geoffery Lyons, MD  ofloxacin (OCUFLOX) 0.3 % ophthalmic solution Place 1 drop into the right eye 4 (four) times daily. Patient not taking: Reported on 06/30/2021 01/30/18   Jacinto Halim, PA-C    Physical Exam: Constitutional: Moderately built and nourished. Vitals:   06/30/21 2200 06/30/21 2245 06/30/21 2330 06/30/21 2345  BP: 108/82 116/77 112/71 96/78  Pulse: (!) 132 (!) 129 (!) 131 (!) 132  Resp: 20 17 (!) 27 20  Temp:      TempSrc:      SpO2: 98% 95% 97% 100%  Weight:      Height:       Eyes: Anicteric no pallor. ENMT: No discharge from the ears eyes nose and mouth. Neck: JVD elevated no mass felt. Respiratory: No rhonchi or crepitations. Cardiovascular: S1-S2 heard. Abdomen: Soft nontender bowel  sound present. Musculoskeletal: Bilateral lower extremity edema present. Skin: No rash. Neurologic: Alert awake oriented time place and person.  Moves all extremities. Psychiatric: Appears normal.  Normal affect.   Labs on Admission: I have personally reviewed following labs and imaging studies  CBC: Recent Labs  Lab 06/30/21 1621  WBC 3.8*  HGB 13.9  HCT 43.7  MCV 111.5*  PLT 166   Basic Metabolic Panel: Recent Labs  Lab 06/30/21 1621  NA 140  K 4.0  CL 107  CO2 28  GLUCOSE 125*  BUN 11  CREATININE 0.99  CALCIUM  8.9   GFR: Estimated Creatinine Clearance: 88.5 mL/min (by C-G formula based on SCr of 0.99 mg/dL). Liver Function Tests: No results for input(s): AST, ALT, ALKPHOS, BILITOT, PROT, ALBUMIN in the last 168 hours. No results for input(s): LIPASE, AMYLASE in the last 168 hours. No results for input(s): AMMONIA in the last 168 hours. Coagulation Profile: No results for input(s): INR, PROTIME in the last 168 hours. Cardiac Enzymes: No results for input(s): CKTOTAL, CKMB, CKMBINDEX, TROPONINI in the last 168 hours. BNP (last 3 results) No results for input(s): PROBNP in the last 8760 hours. HbA1C: No results for input(s): HGBA1C in the last 72 hours. CBG: No results for input(s): GLUCAP in the last 168 hours. Lipid Profile: No results for input(s): CHOL, HDL, LDLCALC, TRIG, CHOLHDL, LDLDIRECT in the last 72 hours. Thyroid Function Tests: No results for input(s): TSH, T4TOTAL, FREET4, T3FREE, THYROIDAB in the last 72 hours. Anemia Panel: No results for input(s): VITAMINB12, FOLATE, FERRITIN, TIBC, IRON, RETICCTPCT in the last 72 hours. Urine analysis:    Component Value Date/Time   COLORURINE YELLOW 03/09/2020 1159   APPEARANCEUR CLOUDY (A) 03/09/2020 1159   LABSPEC 1.015 03/09/2020 1159   PHURINE 6.0 03/09/2020 1159   GLUCOSEU NEGATIVE 03/09/2020 1159   HGBUR SMALL (A) 03/09/2020 1159   BILIRUBINUR NEGATIVE 03/09/2020 1159   KETONESUR NEGATIVE 03/09/2020 1159   PROTEINUR NEGATIVE 03/09/2020 1159   UROBILINOGEN 2.0 (H) 10/21/2013 1749   NITRITE NEGATIVE 03/09/2020 1159   LEUKOCYTESUR LARGE (A) 03/09/2020 1159   Sepsis Labs: @LABRCNTIP (procalcitonin:4,lacticidven:4) )No results found for this or any previous visit (from the past 240 hour(s)).   Radiological Exams on Admission: DG Chest 2 View  Result Date: 06/30/2021 CLINICAL DATA:  TACHYCARDIA; edema atrial fibrillation EXAM: CHEST - 2 VIEW COMPARISON:  July 12, 2018 FINDINGS: The cardiomediastinal silhouette is enlarged in  contour. Cardiomegaly appears increased in comparison to prior. Status post median sternotomy. No pleural effusion. No pneumothorax. No acute pleuroparenchymal abnormality. Visualized abdomen is unremarkable. No acute osseous abnormality. IMPRESSION: Increased size of the cardiomediastinal silhouette in comparison to prior. While this may be partially projectional in etiology, other differential considerations include increased cardiomegaly or a pericardial effusion. Recommend correlation with echocardiogram. Electronically Signed   By: July 14, 2018 MD   On: 06/30/2021 16:38    EKG: Independently reviewed.  A flutter with RVR.  Assessment/Plan Principal Problem:   Acute CHF (congestive heart failure) (HCC) Active Problems:   Atrial flutter with rapid ventricular response (HCC)   TOF (tetralogy of Fallot)    Atrial flutter with RVR presently on amiodarone drip digoxin and heparin infusion.  Appreciate cardiology input.  Plan is to eventually see if patient may need cardioversion versus ablation.  Check TSH. Acute CHF with bedside echo showing biventricular failure presently on IV Lasix 40 mg every 12.  Losartan and spironolactone has been added.  Likely precipitated by atrial flutter. History of Tetralogy of Fallot  status postrepair with pulmonary valve replacement with conduit x2 last one done was in January 2021.  COVID test is pending.   Since patient has atrial flutter with RVR and acute CHF will need close monitoring and inpatient status.   Patient is planned to be transferred to [facility] to his adult congenital heart disease cardiologist once bed available.   DVT prophylaxis: Heparin infusion. Code Status: Full code. Family Communication: Discussed with patient. Disposition Plan: Home. Consults called: Cardiology. Admission status: Inpatient.   Eduard Clos MD [facility] Pager [phone].  If 7PM-7AM, please contact  night-coverage [email or web address] Password [email or web address]  07/01/2021, 12:01 AM

## 2021-07-01 NOTE — Progress Notes (Signed)
Progress Note  Patient Name: Cole Hebert Date of Encounter: 07/01/2021  [facility] Cardiologist: [facility]  Subjective   No compalints this AM  Inpatient Medications    Scheduled Meds:  digoxin  0.125 mg Oral Daily   furosemide  40 mg Intravenous BID   losartan  25 mg Oral Daily   spironolactone  12.5 mg Oral Daily   Continuous Infusions:  amiodarone 30 mg/hr (07/01/21 0220)   heparin 950 Units/hr (07/01/21 0841)   PRN Meds: acetaminophen **OR** acetaminophen, alum & mag hydroxide-simeth   Vital Signs    Vitals:   07/01/21 0221 07/01/21 0612 07/01/21 0830 07/01/21 0849  BP:  (!) 86/57 100/66   Pulse: (!) 125  91 87  Resp: 18 18 18    Temp: 98.5 F (36.9 C) 98.5 F (36.9 C) 98.2 F (36.8 C)   TempSrc: Oral Oral Oral   SpO2: 100% 98% 99%   Weight: 51 kg     Height: 5\' 4"  (1.626 m)       Intake/Output Summary (Last 24 hours) at 07/01/2021 0900 Last data filed at 07/01/2021 0552 Gross per 24 hour  Intake 702.89 ml  Output 1900 ml  Net -1197.11 ml   Last 3 Weights 07/01/2021 06/30/2021 07/11/2019  Weight (lbs) 112 lb 8 oz 120 lb 110 lb  Weight (kg) 51.03 kg 54.432 kg 49.896 kg      Telemetry    SR- Personally Reviewed  ECG    N/a - Personally Reviewed  Physical Exam   GEN: No acute distress.   Neck: No JVD Cardiac: RRR, no murmurs, rubs, or gallops.  Respiratory: decreased breath sounds bilateral bases GI: Soft, nontender, non-distended  MS: No edema; No deformity. Neuro:  Nonfocal  Psych: Normal affect   Labs    High Sensitivity Troponin:   Recent Labs  Lab 06/30/21 1621 06/30/21 1821  TROPONINIHS 46* 38*      Chemistry Recent Labs  Lab 06/30/21 1621 07/01/21 0646  NA 140 140  K 4.0 3.9  CL 107 106  CO2 28 22  GLUCOSE 125* 107*  BUN 11 11  CREATININE 0.99 1.02  CALCIUM 8.9 8.8*  GFRNONAA >60 >60  ANIONGAP 5 12     Hematology Recent Labs  Lab 06/30/21 1621 07/01/21 0646  WBC 3.8* 5.1  RBC 3.92* 3.93*  HGB 13.9  13.9  HCT 43.7 41.7  MCV 111.5* 106.1*  MCH 35.5* 35.4*  MCHC 31.8 33.3  RDW 12.7 12.4  PLT 166 160    BNP Recent Labs  Lab 06/30/21 1621  BNP 1,021.5*     DDimer No results for input(s): DDIMER in the last 168 hours.   Radiology    DG Chest 2 View  Result Date: 06/30/2021 CLINICAL DATA:  TACHYCARDIA; edema atrial fibrillation EXAM: CHEST - 2 VIEW COMPARISON:  July 12, 2018 FINDINGS: The cardiomediastinal silhouette is enlarged in contour. Cardiomegaly appears increased in comparison to prior. Status post median sternotomy. No pleural effusion. No pneumothorax. No acute pleuroparenchymal abnormality. Visualized abdomen is unremarkable. No acute osseous abnormality. IMPRESSION: Increased size of the cardiomediastinal silhouette in comparison to prior. While this may be partially projectional in etiology, other differential considerations include increased cardiomegaly or a pericardial effusion. Recommend correlation with echocardiogram. Electronically Signed   By: 07/02/2021 MD   On: 06/30/2021 16:38    Cardiac Studies     Patient Profile     Fermon Ureta is a 25 y.o. male with a hx of tetralogy of Fallot status  postrepair, pulmonary valve replacement with conduit x2 (most recent January 2021), moderate aortic root dilation, endocarditis who is being seen today for the evaluation of atrial flutter at the request of Cathren Laine, <D.  Assessment & Plan    1.Atrial flutter - from notes adenosine given in ER, clear flutter waves shown - started on amio drip, also given digoxin0.125mg   - bedside echo with reported LV and RV dysfunction, avoiding AV nodal agents - started on heparin gtt  - tele looks like he has converted to SR with first degree AV block, with 1st degree block stop his digoxin and continue amio gtt today, if rhythm holds may change amio to oral tomorrow.   2. Acute biventricular HF - history of Tetraology of Fallot - -EF in February last year 56% at  [facility].  He had mildly reduced RV function.  Has had 2 pulmonary conduits -awaiting official echo, limited bedside echo suggested LVEF around 30%, significant RV dysfunction - BNP 1021 - started on losartan 25mg  daily (on enalapril at home, required washout prior to entresto, at this time bp's unlikely to tolerate).Lower losartan to 12.5mg  daily due to soft bp's - with acute volume overload avoiding beta blocker. Soft bp's, hold on aldactone at this time as well  - neg 1.2 L overnight, weights down 8 lbs would suspect inaccurate. Renal function is stable.  - continue IV lasix.    3.  Tetralogy of Fallot/status postrepair/status post pulmonary valve conduit (most recent procedure January 2021) - awaiting transfer to [facility] for management of his congenital heart disease.    For questions or updates, please contact [facility] Please consult [email or web address] for contact info under        Signed, [facility], MD  07/01/2021, 9:00 AM

## 2021-07-01 NOTE — Progress Notes (Signed)
ANTICOAGULATION CONSULT NOTE - Follow Up Consult  Pharmacy Consult for IV heparin Indication: atrial fibrillation  No Known Allergies  Patient Measurements: Height: 5\' 4"  (162.6 cm) Weight: 51 kg (112 lb 8 oz) IBW/kg (Calculated) : 59.2 Heparin Dosing Weight: 51 kg  Vital Signs: Temp: 97.7 F (36.5 C) (07/23 1123) Temp Source: Oral (07/23 1123) BP: 94/63 (07/23 1123) Pulse Rate: 90 (07/23 1123)  Labs: Recent Labs    06/30/21 1621 06/30/21 1821 07/01/21 0646 07/01/21 1456  HGB 13.9  --  13.9  --   HCT 43.7  --  41.7  --   PLT 166  --  160  --   HEPARINUNFRC  --   --  0.12* 0.25*  CREATININE 0.99  --  1.02  --   TROPONINIHS 46* 38*  --   --      Estimated Creatinine Clearance: 80.6 mL/min (by C-G formula based on SCr of 1.02 mg/dL).  Assessment: Cole Hebert is a 56 YOM presenting with shortness of breath and found to be in aflutter. Plan is for patient to be cardioverted vs ablation. Pharmacy consulted to dose heparin.   Heparin level this afternoon is SUBtherapeutic but trending up after a rate increase yesterday (HL 0.25 << 0.12, goal of 0.3-0.7). No bleeding or issues noted per discussion with RN.   Goal of Therapy:  Heparin level 0.3-0.7 units/ml Monitor platelets by anticoagulation protocol: Yes   Plan:  - Increase Heparin to 1050 units/hr (10.5 ml/hr)  - Will continue to monitor for any signs/symptoms of bleeding and will follow up with heparin level in 6 hours   Thank you for allowing pharmacy to be a part of this patient's care.  25, PharmD, BCPS Clinical Pharmacist Clinical phone for 07/01/2021: [phone] 07/01/2021 4:14 PM   **Pharmacist phone directory can now be found on [email or web address] (PW [email or web address]).  Listed under [facility].

## 2021-07-01 NOTE — Progress Notes (Signed)
I spoke with [facility] transfer service and confirmed patient is on the list for transfer, there are no beds available at this time. The contact number was [phone]   Dina Rich MD

## 2021-07-01 NOTE — Progress Notes (Signed)
ANTICOAGULATION CONSULT NOTE - Follow Up Consult  Pharmacy Consult for IV heparin Indication: atrial fibrillation  No Known Allergies  Patient Measurements: Height: 5\' 4"  (162.6 cm) Weight: 51 kg (112 lb 8 oz) IBW/kg (Calculated) : 59.2 Heparin Dosing Weight: 51 kg  Vital Signs: Temp: 98.5 F (36.9 C) (07/23 0612) Temp Source: Oral (07/23 0612) BP: 86/57 (07/23 0612) Pulse Rate: 125 (07/23 0221)  Labs: Recent Labs    06/30/21 1621 06/30/21 1821 07/01/21 0646  HGB 13.9  --  13.9  HCT 43.7  --  41.7  PLT 166  --  160  HEPARINUNFRC  --   --  0.12*  CREATININE 0.99  --  1.02  TROPONINIHS 46* 38*  --     Estimated Creatinine Clearance: 80.6 mL/min (by C-G formula based on SCr of 1.02 mg/dL).  Assessment: Cole Hebert is a 86 YOM presenting with shortness of breath and found to be in aflutter. Plan is for patient to be cardioverted vs ablation. Pharmacy consulted to dose heparin.   Heparin level 0.12 and subtherapeutic. Hgb/Hct, PLT stable and WNL. No s/sx of bleeding or issues with IV site per RN.   Goal of Therapy:  Heparin level 0.3-0.7 units/ml Monitor platelets by anticoagulation protocol: Yes   Plan:  Increase IV heparin gtt to 950 units/hr 6h heparin level check Daily heparin level, CBC Monitor for s/sx of bleeding  Thank you for involving pharmacy in this patient's care.  26, PharmD PGY1 Ambulatory Care Pharmacy Resident 07/01/2021 8:27 AM  **Pharmacist phone directory can be found on [email or web address] listed under [facility]**

## 2021-07-01 NOTE — Progress Notes (Signed)
ANTICOAGULATION CONSULT NOTE - Follow Up Consult  Pharmacy Consult for IV heparin Indication: atrial fibrillation  Assessment: Cole Hebert is a 46 YOM presenting with shortness of breath and found to be in aflutter. Plan is for patient to be cardioverted vs ablation. Pharmacy consulted to dose heparin.   Heparin level 0.49 units/ml  Goal of Therapy:  Heparin level 0.3-0.7 units/ml Monitor platelets by anticoagulation protocol: Yes   Plan:  - Continue Heparin at 1050 units/hr (10.5 ml/hr)  - Will continue to monitor for any signs/symptoms of bleeding and will follow up with am labs  Thanks for allowing pharmacy to be a part of this patient's care.  Talbert Cage, PharmD Clinical Pharmacist

## 2021-07-02 ENCOUNTER — Inpatient Hospital Stay (HOSPITAL_COMMUNITY): Payer: Medicaid Other

## 2021-07-02 DIAGNOSIS — I5021 Acute systolic (congestive) heart failure: Secondary | ICD-10-CM | POA: Diagnosis not present

## 2021-07-02 LAB — COMPREHENSIVE METABOLIC PANEL
ALT: 186 U/L — ABNORMAL HIGH (ref 0–44)
AST: 191 U/L — ABNORMAL HIGH (ref 15–41)
Albumin: 3.7 g/dL (ref 3.5–5.0)
Alkaline Phosphatase: 59 U/L (ref 38–126)
Anion gap: 10 (ref 5–15)
BUN: 13 mg/dL (ref 6–20)
CO2: 26 mmol/L (ref 22–32)
Calcium: 9 mg/dL (ref 8.9–10.3)
Chloride: 101 mmol/L (ref 98–111)
Creatinine, Ser: 1.41 mg/dL — ABNORMAL HIGH (ref 0.61–1.24)
GFR, Estimated: >60 mL/min (ref >60)
Glucose, Bld: 122 mg/dL — ABNORMAL HIGH (ref 70–99)
Potassium: 4.3 mmol/L (ref 3.5–5.1)
Sodium: 137 mmol/L (ref 135–145)
Total Bilirubin: 2.2 mg/dL — ABNORMAL HIGH (ref 0.3–1.2)
Total Protein: 6 g/dL — ABNORMAL LOW (ref 6.5–8.1)

## 2021-07-02 LAB — CBC
HCT: 43.9 % (ref 39.0–52.0)
Hemoglobin: 14.3 g/dL (ref 13.0–17.0)
MCH: 35.2 pg — ABNORMAL HIGH (ref 26.0–34.0)
MCHC: 32.6 g/dL (ref 30.0–36.0)
MCV: 108.1 fL — ABNORMAL HIGH (ref 80.0–100.0)
Platelets: 184 10*3/uL (ref 150–400)
RBC: 4.06 MIL/uL — ABNORMAL LOW (ref 4.22–5.81)
RDW: 12.7 % (ref 11.5–15.5)
WBC: 4.6 10*3/uL (ref 4.0–10.5)
nRBC: 0 % (ref 0.0–0.2)

## 2021-07-02 LAB — ECHOCARDIOGRAM COMPLETE
AR max vel: 3.24 cm2
AV Area VTI: 3.16 cm2
AV Area mean vel: 3.32 cm2
AV Mean grad: 1 mmHg
AV Peak grad: 2.3 mmHg
Ao pk vel: 0.75 m/s
Area-P 1/2: 5.54 cm2
Height: 64 in
MV M vel: 3.79 m/s
MV Peak grad: 57.5 mmHg
MV VTI: 1.33 cm2
P 1/2 time: 499 msec
Radius: 0.45 cm
S' Lateral: 4.2 cm
Weight: 1800 oz

## 2021-07-02 LAB — HEPARIN LEVEL (UNFRACTIONATED): Heparin Unfractionated: 0.32 IU/mL (ref 0.30–0.70)

## 2021-07-02 LAB — MAGNESIUM: Magnesium: 2.1 mg/dL (ref 1.7–2.4)

## 2021-07-02 MED ORDER — LOSARTAN POTASSIUM 25 MG PO TABS: 12.5000 mg | ORAL_TABLET | Freq: Every day | ORAL | Status: AC

## 2021-07-02 MED ORDER — AMIODARONE HCL 400 MG PO TABS: 400.0000 mg | ORAL_TABLET | Freq: Two times a day (BID) | ORAL | Status: DC

## 2021-07-02 MED ORDER — PERFLUTREN LIPID MICROSPHERE
1.0000 mL | INTRAVENOUS | Status: AC | PRN
  Administered 2021-07-02: 3 mL via INTRAVENOUS
  Filled 2021-07-02: qty 10

## 2021-07-02 MED ORDER — AMIODARONE HCL 200 MG PO TABS
400.0000 mg | ORAL_TABLET | Freq: Two times a day (BID) | ORAL | Status: DC
  Administered 2021-07-02: 400 mg via ORAL
  Filled 2021-07-02: qty 2

## 2021-07-02 MED ORDER — ONDANSETRON HCL 4 MG/2ML IJ SOLN: 4.0000 mg | Freq: Three times a day (TID) | INTRAMUSCULAR | Status: DC | PRN

## 2021-07-02 NOTE — Progress Notes (Signed)
  Echocardiogram 2D Echocardiogram has been performed.  Gerda Diss 07/02/2021, 9:39 AM

## 2021-07-02 NOTE — Discharge Summary (Signed)
Discharge Summary  Cole Hebert ONG:[id number] DOB: [birth year]  PCP: Patient, No Pcp Per (Inactive)  Admit date: 06/30/2021 Discharge date: 07/02/2021  Time spent: 40 mins   Recommendations for Outpatient Follow-up:  As recommended by Cardiologist at [facility]  Discharge Diagnoses:  Active Hospital Problems   Diagnosis Date Noted   Acute CHF (congestive heart failure) (HCC) 06/30/2021   Atrial flutter with rapid ventricular response (HCC) 06/30/2021   TOF (tetralogy of Fallot) 06/30/2021    Resolved Hospital Problems  No resolved problems to display.    Discharge Condition: Stable  Diet recommendation: Heart healthy  Vitals:   07/01/21 1947 07/02/21 0613  BP: 93/67   Pulse: (!) 101   Resp: 18 18  Temp: 98 F (36.7 C) 98 F (36.7 C)  SpO2: 99%     History of present illness:  Cole Hebert is a 25 y.o. 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  Denies any chest pain productive cough or fever or chills. In the ER patient was found to be in atrial flutter with RVR. Chest x-ray showed increased cardiomegaly.  BNP was 1021 and high sensitive troponin was 46 and 38.  On-call cardiologist was consulted and had a bedside 2D echo done which showed features concerning for biventricular failure.  Patient was started on amiodarone drip and digoxin with heparin infusion for the atrial flutter and IV Lasix for the CHF.  Patient was admitted for further management and subsequently was transferred to his adult congenital heart disease cardiologist at [facility] on 07/02/21.     Today, pt reported some generalized abdominal pain, nausea with some intermittent non-bloody vomiting. Still constipated. Denies any chest pain, worsening SOB,  fever/chills.    Hospital Course:  Principal Problem:   Acute CHF (congestive heart failure) (HCC) Active Problems:   Atrial flutter with rapid ventricular response (HCC)   TOF (tetralogy of Fallot)    Atrial flutter with RVR Rate with better control TSH 3.11 Cardiology consulted, s/p amiodarone drip, stopped digoxin as patient may have converted to sinus rhythm with first-degree AV block. Switched to PO amiodarone Was on heparin drip Further management per [facility] cardiologist   Acute biventricular HF BNP 1021 Echo showed EF of 25-30%, LV global hypokinesis Held IV Lasix today as Cr bumped  Started on losartan 25 mg which was reduced to 12.5 due to soft BP Held home enalapril in anticipation to starting possible Entresto upon d/c, not on Aldactone for now due to soft BP Further management per [facility] cardiologist   Tetralogy of Fallot Status post repair/pulmonary valve conduit most recent done in January 2021 Further management per [facility] cardiologist    Estimated body mass index is 19.31 kg/m as calculated from the following:   Height as of this encounter: 5\' 4"  (1.626 m).   Weight as of this encounter: 51 kg.    Procedures: NOne  Consultations: Cardiologist  Discharge Exam: BP 93/67 (BP Location: Left Arm)   Pulse (!) 101   Temp 98 F (36.7 C) (Oral)   Resp 18   Ht 5\' 4"  (1.626 m)   Wt 51 kg   SpO2 99%   BMI 19.31 kg/m   General: NAD  Cardiovascular: S1, S2 present Respiratory: CTAB Abdomen: Soft, nontender, nondistended,  bowel sounds present Musculoskeletal: No bilateral pedal edema noted Skin: Normal Psychiatry: Normal mood   Discharge Instructions You were cared for by a hospitalist during your hospital stay. If you have any questions about your discharge medications or the care you received while you were in the hospital after you are discharged, you can call the unit and asked to speak with the hospitalist on call if the  hospitalist that took care of you is not available. Once you are discharged, your primary care physician will handle any further medical issues. Please note that NO REFILLS for any discharge medications will be authorized once you are discharged, as it is imperative that you return to your primary care physician (or establish a relationship with a primary care physician if you do not have one) for your aftercare needs so that they can reassess your need for medications and monitor your lab values.  Discharge Instructions     Diet - low sodium heart healthy   Complete by: As directed    Increase activity slowly   Complete by: As directed       Allergies as of 07/02/2021   No Known Allergies      Medication List     STOP taking these medications    azithromycin 250 MG tablet Commonly known as: Zithromax Z-Pak   ciprofloxacin 500 MG tablet Commonly known as: Cipro   enalapril 10 MG tablet Commonly known as: VASOTEC   guaiFENesin-codeine 100-10 MG/5ML syrup   HYDROcodone-acetaminophen 5-325 MG tablet Commonly known as: Norco   ofloxacin 0.3 % ophthalmic solution Commonly known as: OCUFLOX       TAKE these medications    amiodarone 400 MG tablet Commonly known as: PACERONE Take 1 tablet (400 mg total) by mouth 2 (two) times daily.   losartan 25 MG tablet Commonly known as: COZAAR Take 0.5 tablets (12.5 mg total) by mouth daily. Start taking on: July 03, 2021       No Known Allergies    The results of significant diagnostics from this hospitalization (including imaging, microbiology, ancillary and laboratory) are listed below for reference.    Significant Diagnostic Studies: DG Chest 2 View  Result Date: 06/30/2021 CLINICAL DATA:  TACHYCARDIA; edema atrial fibrillation EXAM: CHEST - 2 VIEW COMPARISON:  July 12, 2018 FINDINGS: The cardiomediastinal silhouette is enlarged in contour. Cardiomegaly appears increased in comparison to prior. Status post median  sternotomy. No pleural effusion. No pneumothorax. No acute pleuroparenchymal abnormality. Visualized abdomen is unremarkable. No acute osseous abnormality. IMPRESSION: Increased size of the cardiomediastinal silhouette in comparison to prior. While this may be partially projectional in etiology, other differential considerations include increased cardiomegaly or a pericardial effusion. Recommend correlation with echocardiogram. Electronically Signed   By: Meda Klinefelter MD   On: 06/30/2021 16:38   ECHOCARDIOGRAM COMPLETE  Result Date: 07/02/2021    ECHOCARDIOGRAM REPORT   Patient Name:   Cole Hebert Date of Exam: 07/02/2021 Medical Rec #:  [id number]     Height:       64.0 in Accession #:    [id number]    Weight:       112.5 lb Date of Birth:  12/17/1995      BSA:          1.532 m Patient Age:    25 years      BP:           96/67 mmHg Patient Gender: M  HR:           101 bpm. Exam Location:  Inpatient Procedure: 2D Echo, Cardiac Doppler, Color Doppler and Intracardiac            Opacification Agent Indications:    CHF-Acute Systolic  History:        Patient has no prior history of Echocardiogram examinations.                 Arrythmias:Atrial Flutter. S/P Tetralogy of Fallor repair. S/P 2                 PVR conduit. Orthopnea.  Sonographer:    Ross Ludwig RDCS (AE) Referring Phys: [id number] Meryle Ready [facility] IMPRESSIONS  1. History ot Tetrology of Fallot.  2. Left ventricular ejection fraction, by estimation, is 25-30%. The left ventricle has severely decreased function. The left ventricle demonstrates global hypokinesis. Left ventricular diastolic parameters are indeterminate, likely restrictive. There is the interventricular septum is mildly flattened in systole and diastole, consistent with right ventricular pressure and volume overload.  3. Status post VSD closure 1999 with subsequent closure of residual VSD 2007. No residual VSD noted.  4. Right ventricular systolic function is severely reduced.  Prominent apical trabeculation. The right ventricular size is moderately enlarged. Mildly increased right ventricular wall thickness. There is severely elevated pulmonary artery systolic pressure. The estimated right ventricular systolic pressure is 78.2 mmHg.  5. Right atrial size was severely dilated.  6. The mitral valve is abnormal, mildly thickened with restricted posterior leaflet. Mild mitral valve regurgitation.  7. Tricuspid valve regurgitation is moderate to severe.  8. The aortic valve is tricuspid. Aortic valve regurgitation is moderate to possibly severe. Aortic regurgitation PHT measures 499 msec. There is some diastolic flow reversal noted in the aortic arch.  9. Patient status post Blalock-Taussig shunt 1998, 14 mm RV-PA conduit 1999, PVR with 24 mm conduit 2007, and PVR with 28 mm pulmonary homograft 2021. Color Doppler flow at the [facility] homograft does not appear stenosed on limited assessment. There is pulmonic regurgitation that is likely moderate to severe based on shape and density of PR envelope. 10. The inferior vena cava is normal in size with <50% respiratory variability, suggesting right atrial pressure of 8 mmHg. FINDINGS  Left Ventricle: Left ventricular ejection fraction, by estimation, is 25 to 30%. The left ventricle has severely decreased function. The left ventricle demonstrates global hypokinesis. Definity contrast agent was given IV to delineate the left ventricular endocardial borders. The left ventricular internal cavity size was normal in size. There is borderline left ventricular hypertrophy. The interventricular septum is flattened in systole and diastole, consistent with right ventricular pressure and volume overload. Left ventricular diastolic parameters are indeterminate. Right Ventricle: The right ventricular size is moderately enlarged. Mildly increased right ventricular wall thickness. Right ventricular systolic function is severely reduced. There is severely elevated  pulmonary artery systolic pressure. The tricuspid regurgitant velocity is 4.19 m/s, and with an assumed right atrial pressure of 8 mmHg, the estimated right ventricular systolic pressure is 78.2 mmHg. Left Atrium: Left atrial size was normal in size. Right Atrium: Right atrial size was severely dilated. Pericardium: There is no evidence of pericardial effusion. Mitral Valve: The mitral valve is abnormal. There is mild thickening of the mitral valve leaflet(s). Mild mitral valve regurgitation. MV peak gradient, 5.3 mmHg. The mean mitral valve gradient is 2.0 mmHg. Tricuspid Valve: The tricuspid valve is grossly normal. Tricuspid valve regurgitation is moderate to severe. Aortic Valve: The aortic valve  is tricuspid. Aortic valve regurgitation is moderate to severe. Aortic regurgitation PHT measures 499 msec. Aortic valve mean gradient measures 1.0 mmHg. Aortic valve peak gradient measures 2.3 mmHg. Aortic valve area, by VTI measures 3.16 cm. Pulmonic Valve: Patient status post Blalock-Taussig shunt 1998, 14 mm RV-PA conduit 1999, PVR with 24 mm conduit 2007, and PVR with 28 mm pulmonary homograft 2021. The pulmonic valve was abnormal. Pulmonic valve regurgitation is moderate to severe. Aorta: The aortic root is normal in size and structure. Venous: The inferior vena cava is normal in size with less than 50% respiratory variability, suggesting right atrial pressure of 8 mmHg. IAS/Shunts: There is left bowing of the interatrial septum, suggestive of elevated right atrial pressure. No atrial level shunt detected by color flow Doppler.  LEFT VENTRICLE PLAX 2D LVIDd:         5.10 cm  Diastology LVIDs:         4.20 cm  LV e' medial:    6.64 cm/s LV PW:         0.90 cm  LV E/e' medial:  22.7 LV IVS:        1.20 cm  LV e' lateral:   9.25 cm/s LVOT diam:     2.40 cm  LV E/e' lateral: 16.3 LV SV:         36 LV SV Index:   23 LVOT Area:     4.52 cm  RIGHT VENTRICLE            IVC RV Basal diam:  4.25 cm    IVC diam: 1.50 cm RV  Mid diam:    3.25 cm RV S prime:     7.51 cm/s TAPSE (M-mode): 1.2 cm LEFT ATRIUM             Index       RIGHT ATRIUM           Index LA diam:        3.00 cm 1.96 cm/m  RA Area:     24.10 cm LA Vol (A2C):   42.3 ml 27.61 ml/m RA Volume:   83.40 ml  54.44 ml/m LA Vol (A4C):   31.6 ml 20.63 ml/m LA Biplane Vol: 37.9 ml 24.74 ml/m  AORTIC VALVE AV Area (Vmax):    3.24 cm AV Area (Vmean):   3.32 cm AV Area (VTI):     3.16 cm AV Vmax:           75.40 cm/s AV Vmean:          53.550 cm/s AV VTI:            0.112 m AV Peak Grad:      2.3 mmHg AV Mean Grad:      1.0 mmHg LVOT Vmax:         54.00 cm/s LVOT Vmean:        39.300 cm/s LVOT VTI:          0.079 m LVOT/AV VTI ratio: 0.70 AI PHT:            499 msec  AORTA Ao Root diam: 3.60 cm MITRAL VALVE                 TRICUSPID VALVE MV Area (PHT): 5.54 cm      TR Peak grad:   70.2 mmHg MV Area VTI:   1.33 cm      TR Vmax:        419.00 cm/s MV Peak grad:  5.3 mmHg MV Mean grad:  2.0 mmHg      SHUNTS MV Vmax:       1.15 m/s      Systemic VTI:  0.08 m MV Vmean:      57.8 cm/s     Systemic Diam: 2.40 cm MV Decel Time: 137 msec MR Peak grad:    57.5 mmHg MR Mean grad:    38.0 mmHg MR Vmax:         379.00 cm/s MR Vmean:        292.0 cm/s MR PISA:         1.27 cm MR PISA Eff ROA: 10 mm MR PISA Radius:  0.45 cm MV E velocity: 151.00 cm/s MV A velocity: 43.50 cm/s MV E/A ratio:  3.47 Nona Dell MD Electronically signed by Nona Dell MD Signature Date/Time: 07/02/2021/11:59:50 AM    Final     Microbiology: Recent Results (from the past 240 hour(s))  SARS CORONAVIRUS 2 (TAT 6-24 HRS) Nasopharyngeal Nasopharyngeal Swab     Status: None   Collection Time: 07/01/21 12:39 AM   Specimen: Nasopharyngeal Swab  Result Value Ref Range Status   SARS Coronavirus 2 NEGATIVE NEGATIVE Final    Comment: (NOTE) SARS-CoV-2 target nucleic acids are NOT DETECTED.  The SARS-CoV-2 RNA is generally detectable in upper and lower respiratory specimens during the acute phase  of infection. Negative results do not preclude SARS-CoV-2 infection, do not rule out co-infections with other pathogens, and should not be used as the sole basis for treatment or other patient management decisions. Negative results must be combined with clinical observations, patient history, and epidemiological information. The expected result is Negative.  Fact Sheet for Patients: HairSlick.no  Fact Sheet for Healthcare Providers: quierodirigir.com  This test is not yet approved or cleared by the [place] FDA and  has been authorized for detection and/or diagnosis of SARS-CoV-2 by FDA under an Emergency Use Authorization (EUA). This EUA will remain  in effect (meaning this test can be used) for the duration of the COVID-19 declaration under Se ction 564(b)(1) of the Act, 21 U.S.C. section 360bbb-3(b)(1), unless the authorization is terminated or revoked sooner.  Performed at [facility], [address], [place], [place] [address]      Labs: Basic Metabolic Panel: Recent Labs  Lab 06/30/21 1621 07/01/21 0646 07/02/21 0233  NA 140 140 137  K 4.0 3.9 4.3  CL 107 106 101  CO2 GLUCOSE 125* 107* 122*  BUN CREATININE 0.99 1.02 1.41*  CALCIUM 8.9 8.8* 9.0  MG  --  1.6* 2.1   Liver Function Tests: Recent Labs  Lab 07/02/21 0233  AST 191*  ALT 186*  ALKPHOS 59  BILITOT 2.2*  PROT 6.0*  ALBUMIN 3.7   No results for input(s): LIPASE, AMYLASE in the last 168 hours. No results for input(s): AMMONIA in the last 168 hours. CBC: Recent Labs  Lab 06/30/21 1621 07/01/21 0646 07/02/21 0233  WBC 3.8* 5.1 4.6  HGB 13.9 13.9 14.3  HCT 43.7 41.7 43.9  MCV 111.5* 106.1* 108.1*  PLT 166 160 184   Cardiac Enzymes: No results for input(s): CKTOTAL, CKMB, CKMBINDEX, TROPONINI in the last 168 hours. BNP: BNP (last 3 results) Recent Labs    06/30/21 1621  BNP 1,021.5*    ProBNP (last  3 results) No results for input(s): PROBNP in the last 8760 hours.  CBG: No results for input(s): GLUCAP in the last 168  hours.     Signed:  Briant CedarNkeiruka J Kayani Rapaport, MD [facility] 07/02/2021, 12:44 PM

## 2021-07-02 NOTE — Progress Notes (Signed)
I gave report to Gerarda Gunther , RN at [facility] . Patient to be transferred to [facility]. I was called by Lynden Ang at [facility], telephone [phone] with Bed assignment

## 2021-07-02 NOTE — Progress Notes (Addendum)
ANTICOAGULATION CONSULT NOTE - Follow Up Consult  Pharmacy Consult for IV heparin Indication: atrial fibrillation  No Known Allergies  Patient Measurements: Height: 5\' 4"  (162.6 cm) Weight: 51 kg (112 lb 8 oz) IBW/kg (Calculated) : 59.2 Heparin Dosing Weight: 51 kg  Vital Signs: Temp: 98 F (36.7 C) (07/24 0613) Temp Source: Oral (07/24 07-27-2005)  Labs: Recent Labs    06/30/21 1621 06/30/21 1621 06/30/21 1821 07/01/21 0646 07/01/21 1456 07/01/21 2241 07/02/21 0233  HGB 13.9  --   --  13.9  --   --  14.3  HCT 43.7  --   --  41.7  --   --  43.9  PLT 166  --   --  160  --   --  184  HEPARINUNFRC  --    < >  --  0.12* 0.25* 0.49 0.32  CREATININE 0.99  --   --  1.02  --   --  1.41*  TROPONINIHS 46*  --  38*  --   --   --   --    < > = values in this interval not displayed.     Estimated Creatinine Clearance: 58.3 mL/min (A) (by C-G formula based on SCr of 1.41 mg/dL (H)).  Assessment: Mr. Crilly is a 13 YOM presenting with shortness of breath and found to be in aflutter. Pt converted to sinus rhythm with first degree AV block yesterday. Pharmacy consulted to dose heparin.   Confirmatory heparin level 0.32 and therapeutic. Hgb/hct, plt are stable and wnl. No bleeding or IV site issues noted per RN.   Goal of Therapy:  Heparin level 0.3-0.7 units/ml Monitor platelets by anticoagulation protocol: Yes   Plan:  Continue IV heparin gtt @ 1050 units/hr Daily heparin level, CBC Monitor for s/sx of bleeding   Thank you for involving pharmacy in this patient's care.  25, PharmD PGY1 Ambulatory Care Pharmacy Resident 07/02/2021 9:07 AM  **Pharmacist phone directory can be found on [email or web address] listed under [facility]**

## 2021-07-02 NOTE — Progress Notes (Signed)
Progress Note  Patient Name: Cole Hebert Date of Encounter: 07/02/2021  [facility] Cardiologist: [facility]  Subjective   No complaints  Inpatient Medications    Scheduled Meds:  furosemide  40 mg Intravenous BID   losartan  12.5 mg Oral Daily   polyethylene glycol  17 g Oral Daily   Continuous Infusions:  amiodarone Stopped (07/02/21 0531)   heparin 1,050 Units/hr (07/02/21 0604)   PRN Meds: acetaminophen **OR** acetaminophen, alum & mag hydroxide-simeth   Vital Signs    Vitals:   07/01/21 1123 07/01/21 1752 07/01/21 1947 07/02/21 0613  BP: 94/63 98/65 93/67    Pulse: 90 96 (!) 101   Resp: 18 18 18 18   Temp: 97.7 F (36.5 C) 97.7 F (36.5 C) 98 F (36.7 C) 98 F (36.7 C)  TempSrc: Oral Oral Oral Oral  SpO2: 100% 95% 99%   Weight:      Height:        Intake/Output Summary (Last 24 hours) at 07/02/2021 0810 Last data filed at 07/02/2021 0604 Gross per 24 hour  Intake 1158.82 ml  Output 750 ml  Net 408.82 ml   Last 3 Weights 07/01/2021 06/30/2021 07/11/2019  Weight (lbs) 112 lb 8 oz 120 lb 110 lb  Weight (kg) 51.03 kg 54.432 kg 49.896 kg      Telemetry    SR - Personally Reviewed  ECG    N/a - Personally Reviewed  Physical Exam   GEN: No acute distress.   Neck: No JVD Cardiac: RRR, 3/6 systoic murmur LUSB Respiratory: Clear to auscultation bilaterally. GI: Soft, nontender, non-distended  MS: No edema; No deformity. Neuro:  Nonfocal  Psych: Normal affect   Labs    High Sensitivity Troponin:   Recent Labs  Lab 06/30/21 1621 06/30/21 1821  TROPONINIHS 46* 38*      Chemistry Recent Labs  Lab 06/30/21 1621 07/01/21 0646 07/02/21 0233  NA 140 140 137  K 4.0 3.9 4.3  CL 107 106 101  CO2 28 22 26   GLUCOSE 125* 107* 122*  BUN 11 11 13   CREATININE 0.99 1.02 1.41*  CALCIUM 8.9 8.8* 9.0  PROT  --   --  6.0*  ALBUMIN  --   --  3.7  AST  --   --  191*  ALT  --   --  186*  ALKPHOS  --   --  59  BILITOT  --   --  2.2*  GFRNONAA  >60 >60 >60  ANIONGAP 5 12 10      Hematology Recent Labs  Lab 06/30/21 1621 07/01/21 0646 07/02/21 0233  WBC 3.8* 5.1 4.6  RBC 3.92* 3.93* 4.06*  HGB 13.9 13.9 14.3  HCT 43.7 41.7 43.9  MCV 111.5* 106.1* 108.1*  MCH 35.5* 35.4* 35.2*  MCHC 31.8 33.3 32.6  RDW 12.7 12.4 12.7  PLT 166 160 184    BNP Recent Labs  Lab 06/30/21 1621  BNP 1,021.5*     DDimer No results for input(s): DDIMER in the last 168 hours.   Radiology    DG Chest 2 View  Result Date: 06/30/2021 CLINICAL DATA:  TACHYCARDIA; edema atrial fibrillation EXAM: CHEST - 2 VIEW COMPARISON:  July 12, 2018 FINDINGS: The cardiomediastinal silhouette is enlarged in contour. Cardiomegaly appears increased in comparison to prior. Status post median sternotomy. No pleural effusion. No pneumothorax. No acute pleuroparenchymal abnormality. Visualized abdomen is unremarkable. No acute osseous abnormality. IMPRESSION: Increased size of the cardiomediastinal silhouette in comparison to prior. While this may  be partially projectional in etiology, other differential considerations include increased cardiomegaly or a pericardial effusion. Recommend correlation with echocardiogram. Electronically Signed   By: Meda Klinefelter MD   On: 06/30/2021 16:38    Cardiac Studies    Patient Profile        Cole Hebert is a 25 y.o. 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  Assessment & Plan    1.Atrial flutter - from notes adenosine given in ER, clear flutter waves shown - started on amio drip, also given digoxin0.125mg  - bedside echo with reported LV and RV dysfunction, avoiding AV nodal agents - started on heparin gtt  - converted to sinus rhythm with first degree AV block yesterday, given first degree  block digoxin was stopped and continued IV amio - tele shows he remains in SR today. Change amio to oral 400mg  bid x 7 days, then 200mg  bid x 2 weeks, then 200mg  daily.  - would anticipate amio as just short term measure, defer to his congenital heart specialists and likely EP at [facility] longer term strategy, including a potential ablation.  - with aflutter and chemical conversion he is on    2. Acute biventricular HF - history of Tetraology of Fallot - -EF in February last year 56% at [facility].  He had mildly reduced RV function.  Has had 2 pulmonary conduits -awaiting official echo, limited bedside echo suggested LVEF around 30%, significant RV dysfunction - BNP 1021  - medical therapy limited by soft bp's. We changed his ACEI to ARB in anticipation of ultimately transitionting to entresto at some point. WIth soft bp's lowered losartan to 12.5mg  daily.  - with acute volume overload avoiding beta blocker. Soft bp's have not started aldactone.    - incomplete I/Os yesterday. Weights appear inaccurate, ordering daily standing weights. He is on IV lasix 40mg  bid, significnat uptrend in Cr, we will hold lasix today.  - spoke with echo today to have study done.       3.  Tetralogy of Fallot/status postrepair/status post pulmonary valve conduit (most recent procedure January 2021) - awaiting transfer to [facility] for management of his congenital heart disease.   Spoke with transfer center for [facility] [phone] and I am told there is one patient ahead of him for bed placement.       For questions or updates, please contact [facility] Please consult [email or web address] for contact info under        Signed, February 2021, MD  07/02/2021, 8:10 AM

## 2021-07-02 NOTE — Progress Notes (Signed)
I secure chatted Judy Pimple, PA with Cards. Dr. Sharolyn Douglas wanted to clarify with them in regards to heparin gtt being continued on transport. She stated to continue the heparin.

## 2021-08-23 ENCOUNTER — Emergency Department (HOSPITAL_COMMUNITY)
Admission: EM | Admit: 2021-08-23 | Discharge: 2021-08-23 | Disposition: A | Discharge status: Home / Self Care | Payer: Medicaid Other | Attending: Emergency Medicine | Admitting: Emergency Medicine

## 2021-08-23 ENCOUNTER — Encounter (HOSPITAL_COMMUNITY): Payer: Self-pay | Admitting: Radiology

## 2021-08-23 ENCOUNTER — Emergency Department (HOSPITAL_COMMUNITY): Payer: Medicaid Other

## 2021-08-23 DIAGNOSIS — I5020 Unspecified systolic (congestive) heart failure: Secondary | ICD-10-CM | POA: Insufficient documentation

## 2021-08-23 DIAGNOSIS — R220 Localized swelling, mass and lump, head: Secondary | ICD-10-CM | POA: Diagnosis not present

## 2021-08-23 DIAGNOSIS — S0083XA Contusion of other part of head, initial encounter: Secondary | ICD-10-CM

## 2021-08-23 MED ORDER — ACETAMINOPHEN 500 MG PO TABS
1000.0000 mg | ORAL_TABLET | Freq: Once | ORAL | Status: AC
  Administered 2021-08-23: 1000 mg via ORAL
  Filled 2021-08-23: qty 2

## 2021-08-23 NOTE — Discharge Instructions (Addendum)
It was our pleasure to provide your ER care today - we hope that you feel better.  Your imaging study shows a contusion/bruise to left side of face, but no bony injury or fracture is seem.   Icepack to area. Take acetaminophen or ibuprofen as need.  Follow up with primary care doctor in 1-2 weeks if symptoms fail to improve/resolve.  Return to ER if worse, new symptoms, fevers, increased swelling/redness, worsening or severe pain, or other concern.
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
## 2021-08-23 NOTE — ED Notes (Signed)
An After Visit Summary was printed and given to the patient. Discharge instructions given and no further questions at this time.  
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
## 2022-02-01 ENCOUNTER — Emergency Department (HOSPITAL_COMMUNITY)
Admission: EM | Admit: 2022-02-01 | Discharge: 2022-02-01 | Disposition: A | Discharge status: Home / Self Care | Payer: Medicaid Other | Attending: Emergency Medicine | Admitting: Emergency Medicine

## 2022-02-01 ENCOUNTER — Other Ambulatory Visit: Payer: Self-pay

## 2022-02-01 ENCOUNTER — Encounter (HOSPITAL_COMMUNITY): Payer: Self-pay | Admitting: Emergency Medicine

## 2022-02-01 DIAGNOSIS — Z79899 Other long term (current) drug therapy: Secondary | ICD-10-CM | POA: Insufficient documentation

## 2022-02-01 DIAGNOSIS — R7401 Elevation of levels of liver transaminase levels: Secondary | ICD-10-CM | POA: Insufficient documentation

## 2022-02-01 DIAGNOSIS — R109 Unspecified abdominal pain: Secondary | ICD-10-CM

## 2022-02-01 DIAGNOSIS — R222 Localized swelling, mass and lump, trunk: Secondary | ICD-10-CM | POA: Diagnosis not present

## 2022-02-01 DIAGNOSIS — R229 Localized swelling, mass and lump, unspecified: Secondary | ICD-10-CM

## 2022-02-01 DIAGNOSIS — K5900 Constipation, unspecified: Secondary | ICD-10-CM | POA: Insufficient documentation

## 2022-02-01 DIAGNOSIS — Z8774 Personal history of (corrected) congenital malformations of heart and circulatory system: Secondary | ICD-10-CM

## 2022-02-01 DIAGNOSIS — R7989 Other specified abnormal findings of blood chemistry: Secondary | ICD-10-CM

## 2022-02-01 LAB — URINALYSIS, ROUTINE W REFLEX MICROSCOPIC
Bilirubin Urine: NEGATIVE
Glucose, UA: NEGATIVE mg/dL
Hgb urine dipstick: NEGATIVE
Ketones, ur: NEGATIVE mg/dL
Leukocytes,Ua: NEGATIVE
Nitrite: NEGATIVE
Protein, ur: NEGATIVE mg/dL
Specific Gravity, Urine: 1.015 (ref 1.005–1.030)
pH: 6 (ref 5.0–8.0)

## 2022-02-01 LAB — COMPREHENSIVE METABOLIC PANEL
ALT: 46 U/L — ABNORMAL HIGH (ref 0–44)
AST: 57 U/L — ABNORMAL HIGH (ref 15–41)
Albumin: 4.3 g/dL (ref 3.5–5.0)
Alkaline Phosphatase: 97 U/L (ref 38–126)
Anion gap: 10 (ref 5–15)
BUN: 12 mg/dL (ref 6–20)
CO2: 26 mmol/L (ref 22–32)
Calcium: 8.9 mg/dL (ref 8.9–10.3)
Chloride: 103 mmol/L (ref 98–111)
Creatinine, Ser: 1.03 mg/dL (ref 0.61–1.24)
GFR, Estimated: >60 mL/min (ref >60)
Glucose, Bld: 93 mg/dL (ref 70–99)
Potassium: 4.2 mmol/L (ref 3.5–5.1)
Sodium: 139 mmol/L (ref 135–145)
Total Bilirubin: 3.1 mg/dL — ABNORMAL HIGH (ref 0.3–1.2)
Total Protein: 7.1 g/dL (ref 6.5–8.1)

## 2022-02-01 LAB — CBC WITH DIFFERENTIAL/PLATELET
Abs Immature Granulocytes: 0.01 10*3/uL (ref 0.00–0.07)
Basophils Absolute: 0.1 10*3/uL (ref 0.0–0.1)
Basophils Relative: 2 %
Eosinophils Absolute: 0.1 10*3/uL (ref 0.0–0.5)
Eosinophils Relative: 2 %
HCT: 43.3 % (ref 39.0–52.0)
Hemoglobin: 14.7 g/dL (ref 13.0–17.0)
Immature Granulocytes: 0 %
Lymphocytes Relative: 31 %
Lymphs Abs: 1.2 10*3/uL (ref 0.7–4.0)
MCH: 35.9 pg — ABNORMAL HIGH (ref 26.0–34.0)
MCHC: 33.9 g/dL (ref 30.0–36.0)
MCV: 105.9 fL — ABNORMAL HIGH (ref 80.0–100.0)
Monocytes Absolute: 0.3 10*3/uL (ref 0.1–1.0)
Monocytes Relative: 9 %
Neutro Abs: 2.2 10*3/uL (ref 1.7–7.7)
Neutrophils Relative %: 56 %
Platelets: 180 10*3/uL (ref 150–400)
RBC: 4.09 MIL/uL — ABNORMAL LOW (ref 4.22–5.81)
RDW: 11.5 % (ref 11.5–15.5)
WBC: 3.9 10*3/uL — ABNORMAL LOW (ref 4.0–10.5)
nRBC: 0 % (ref 0.0–0.2)

## 2022-02-01 LAB — LIPASE, BLOOD: Lipase: 37 U/L (ref 11–51)

## 2022-02-01 NOTE — ED Provider Notes (Signed)
[facility] EMERGENCY DEPARTMENT Provider Note   CSN: [id number] Arrival date & time: 02/01/22  1441     History  Chief Complaint  Patient presents with   Abdominal Pain    Cole Hebert is a 26 y.o. male.  Patient c/o intermittent lower abd pain in past month. Symptoms at rest, comes and goes, states feels gassy/passing gas frequently at times. At other times feels mildly constipated. Did have a couple loose stools in past few days, but no severe diarrhea. No abd distension. No constant and/or focal pain. No scrotal or testicular pain. No dysuria or hematuria. No back/flank pain. Normal appetite. No nausea/vomiting. No fever or chills. Also notes a small lump left side of chest near nipple in past month - not painful or red, has not changed in size.   The history is provided by the patient and medical records.  Abdominal Pain Associated symptoms: no chest pain, no fever, no shortness of breath, no sore throat and no vomiting       Home Medications Prior to Admission medications   Medication Sig Start Date End Date Taking? Authorizing Provider  amiodarone (PACERONE) 400 MG tablet Take 1 tablet (400 mg total) by mouth 2 (two) times daily. 07/02/21   Briant Cedar, MD  losartan (COZAAR) 25 MG tablet Take 0.5 tablets (12.5 mg total) by mouth daily. 07/03/21   Briant Cedar, MD      Allergies    Patient has no known allergies.    Review of Systems   Review of Systems  Constitutional:  Negative for fever.  HENT:  Negative for sore throat.   Eyes:  Negative for redness.  Respiratory:  Negative for shortness of breath.   Cardiovascular:  Negative for chest pain and leg swelling.  Gastrointestinal:  Positive for abdominal pain. Negative for vomiting.  Genitourinary:  Negative for flank pain.  Musculoskeletal:  Negative for back pain.  Skin:  Negative for rash.  Neurological:  Negative for headaches.  Hematological:  Does not bruise/bleed easily.   Psychiatric/Behavioral:  Negative for confusion.    Physical Exam Updated Vital Signs BP 107/61 (BP Location: Left Arm)    Pulse 74    Temp 98.2 F (36.8 C) (Oral)    Resp 16    Ht 1.626 m (5\' 4" )    Wt 50.8 kg    SpO2 95%    BMI 19.22 kg/m  Physical Exam Vitals and nursing note reviewed.  Constitutional:      Appearance: Normal appearance. He is well-developed.  HENT:     Head: Atraumatic.     Nose: Nose normal.     Mouth/Throat:     Mouth: Mucous membranes are moist.  Eyes:     General: No scleral icterus.    Conjunctiva/sclera: Conjunctivae normal.  Neck:     Trachea: No tracheal deviation.  Cardiovascular:     Rate and Rhythm: Normal rate and regular rhythm.     Pulses: Normal pulses.     Heart sounds: Murmur heard.  Pulmonary:     Effort: Pulmonary effort is normal. No accessory muscle usage or respiratory distress.     Breath sounds: Normal breath sounds.     Comments: ?small, < 1 cm, mobile, soft, fleshy nodule left chest wall. Not fluctuant. Not tender. No skin changes, redness, or dimpling. No nipple d/c.   Abdominal:     General: Bowel sounds are normal. There is no distension.     Palpations: Abdomen is soft. There  is no mass.     Tenderness: There is no abdominal tenderness. There is no guarding or rebound.     Hernia: No hernia is present.  Genitourinary:    Comments: No cva tenderness. Musculoskeletal:        General: No swelling.     Cervical back: Normal range of motion and neck supple. No rigidity.     Right lower leg: No edema.     Left lower leg: No edema.  Skin:    General: Skin is warm and dry.     Findings: No rash.  Neurological:     Mental Status: He is alert.     Comments: Alert, speech clear.   Psychiatric:        Mood and Affect: Mood normal.    ED Results / Procedures / Treatments   Labs (all labs ordered are listed, but only abnormal results are displayed) Results for orders placed or performed during the hospital encounter of  02/01/22  CBC with Differential  Result Value Ref Range   WBC 3.9 (L) 4.0 - 10.5 K/uL   RBC 4.09 (L) 4.22 - 5.81 MIL/uL   Hemoglobin 14.7 13.0 - 17.0 g/dL   HCT 43.3 39.0 - 52.0 %   MCV 105.9 (H) 80.0 - 100.0 fL   MCH 35.9 (H) 26.0 - 34.0 pg   MCHC 33.9 30.0 - 36.0 g/dL   RDW 11.5 11.5 - 15.5 %   Platelets 180 150 - 400 K/uL   nRBC 0.0 0.0 - 0.2 %   Neutrophils Relative % 56 %   Neutro Abs 2.2 1.7 - 7.7 K/uL   Lymphocytes Relative 31 %   Lymphs Abs 1.2 0.7 - 4.0 K/uL   Monocytes Relative 9 %   Monocytes Absolute 0.3 0.1 - 1.0 K/uL   Eosinophils Relative 2 %   Eosinophils Absolute 0.1 0.0 - 0.5 K/uL   Basophils Relative 2 %   Basophils Absolute 0.1 0.0 - 0.1 K/uL   Immature Granulocytes 0 %   Abs Immature Granulocytes 0.01 0.00 - 0.07 K/uL  Comprehensive metabolic panel  Result Value Ref Range   Sodium 139 135 - 145 mmol/L   Potassium 4.2 3.5 - 5.1 mmol/L   Chloride 103 98 - 111 mmol/L   CO2 26 22 - 32 mmol/L   Glucose, Bld 93 70 - 99 mg/dL   BUN 12 6 - 20 mg/dL   Creatinine, Ser 1.03 0.61 - 1.24 mg/dL   Calcium 8.9 8.9 - 10.3 mg/dL   Total Protein 7.1 6.5 - 8.1 g/dL   Albumin 4.3 3.5 - 5.0 g/dL   AST 57 (H) 15 - 41 U/L   ALT 46 (H) 0 - 44 U/L   Alkaline Phosphatase 97 38 - 126 U/L   Total Bilirubin 3.1 (H) 0.3 - 1.2 mg/dL   GFR, Estimated >60 >60 mL/min   Anion gap 10 5 - 15  Lipase, blood  Result Value Ref Range   Lipase 37 11 - 51 U/L  Urinalysis, Routine w reflex microscopic Urine, Unspecified Source  Result Value Ref Range   Color, Urine YELLOW YELLOW   APPearance CLEAR CLEAR   Specific Gravity, Urine 1.015 1.005 - 1.030   pH 6.0 5.0 - 8.0   Glucose, UA NEGATIVE NEGATIVE mg/dL   Hgb urine dipstick NEGATIVE NEGATIVE   Bilirubin Urine NEGATIVE NEGATIVE   Ketones, ur NEGATIVE NEGATIVE mg/dL   Protein, ur NEGATIVE NEGATIVE mg/dL   Nitrite NEGATIVE NEGATIVE   Leukocytes,Ua NEGATIVE NEGATIVE  EKG None  Radiology No results  found.  Procedures Procedures    Medications Ordered in ED Medications - No data to display  ED Course/ Medical Decision Making/ A&P                           Medical Decision Making Problems Addressed: Abdominal pain, unspecified abdominal location: acute illness or injury Elevated liver function tests: chronic illness or injury Subcutaneous nodule: acute illness or injury  Amount and/or Complexity of Data Reviewed External Data Reviewed: notes. Labs: ordered. Decision-making details documented in ED Course.  Risk OTC drugs.   Labs sent.   Reviewed nursing notes and prior charts for additional history.  External reports reviewed - pt no longer on amio or eliquis. Pt has seen gi for mild elev lft/bili in past - no definite dx.   Labs reviewed/interpreted by me - wbc c/w prior and not elevated, hgb c/w prior/normal. Mild lft/bili inc similar to prior. Ua neg for uti.   Pt notes intermittent mild constipation, but in past couple days 1-2 loose stools.   Currently abd is soft and non tender. Vitals normal. Afebrile.  Pt appears stable for d/c.   Rec outpt f/u with his doctor/gi doctor.  Return precautions provided.           Final Clinical Impression(s) / ED Diagnoses Final diagnoses:  None    Rx / DC Orders ED Discharge Orders     None         Lajean Saver, MD 02/01/22 [id number]

## 2022-02-01 NOTE — Discharge Instructions (Addendum)
It was our pleasure to provide your ER care today - we hope that you feel better.  For mild elevation of liver tests, follow up with your doctor/gi doctor in the next few weeks.   For small lump in chest area, follow up with your doctor in the next few months (or sooner if increases in size, becomes very painful, or other new symptoms).   If constipated/straining, you may try taking colace (stool softener) 2x/day. Drink plenty of fluids. Get adequate fiber in diet.   Return to ER if worse, new symptoms, fevers, new, worsening, or severe pain, persistent vomiting, trouble breathing, or other concern.

## 2022-02-01 NOTE — ED Triage Notes (Signed)
Patient c/o lower abdominal pain x 2-3 weeks. Reports looser BMs frequently. C/o lump to left chest area x 1 month.

## 2022-02-01 NOTE — ED Notes (Signed)
C/o lump under L breast: peri-areola/nipple, soft, non-mobile, ~2.5" diameter, non-tender. No redness or d/c. First noticed 1 month ago. Also abd pain, onset 1 month ago. Denies NVD. Endorses constipation.

## 2022-02-01 NOTE — ED Notes (Signed)
Pt ambulatory from triage to hallway exam stretcher. Alert, NAD, calm, interactive, steady gait. Family present. VS, lab results and history reviewed.

## 2022-02-01 NOTE — ED Provider Triage Note (Signed)
Emergency Medicine Provider Triage Evaluation Note  Cole Hebert , a 26 y.o. male  was evaluated in triage.  Pt complains of abdominal pain. Pain present in pelvic area. Started 1 month ago. Pain comes and goes. Describes it as cramping. He has associated diarrhea. No constipation, nausea, vomiting, dysuria, hematuria, or fevers. No blood in stool.    Review of Systems  Positive: Abdominal pain, diarrhea Negative:   Physical Exam  BP 119/61 (BP Location: Right Arm)    Pulse 88    Temp 98.6 F (37 C) (Oral)    Resp 18    Ht 5\' 4"  (1.626 m)    Wt 50.8 kg    SpO2 95%    BMI 19.22 kg/m  Gen:   Awake, no distress   Resp:  Normal effort  MSK:   Moves extremities without difficulty  Other:  Abdomen, soft and nontender  Medical Decision Making  Medically screening exam initiated at 3:06 PM.  Appropriate orders placed.  Dajion Wilmes was informed that the remainder of the evaluation will be completed by another provider, this initial triage assessment does not replace that evaluation, and the importance of remaining in the ED until their evaluation is complete.  Abdominal labs placed.    Adolphus Birchwood, [place] 02/01/22 [phone]

## 2022-02-09 ENCOUNTER — Other Ambulatory Visit: Payer: Self-pay

## 2022-02-09 ENCOUNTER — Encounter (HOSPITAL_COMMUNITY): Payer: Self-pay | Admitting: Emergency Medicine

## 2022-02-09 ENCOUNTER — Emergency Department (HOSPITAL_COMMUNITY): Payer: Medicaid Other

## 2022-02-09 ENCOUNTER — Emergency Department (HOSPITAL_COMMUNITY)
Admission: EM | Admit: 2022-02-09 | Discharge: 2022-02-09 | Disposition: A | Discharge status: Home / Self Care | Payer: Medicaid Other | Attending: Emergency Medicine | Admitting: Emergency Medicine

## 2022-02-09 DIAGNOSIS — Y92410 Unspecified street and highway as the place of occurrence of the external cause: Secondary | ICD-10-CM | POA: Diagnosis not present

## 2022-02-09 DIAGNOSIS — S8391XA Sprain of unspecified site of right knee, initial encounter: Secondary | ICD-10-CM | POA: Diagnosis not present

## 2022-02-09 DIAGNOSIS — S0990XA Unspecified injury of head, initial encounter: Secondary | ICD-10-CM | POA: Diagnosis present

## 2022-02-09 DIAGNOSIS — S01111A Laceration without foreign body of right eyelid and periocular area, initial encounter: Secondary | ICD-10-CM | POA: Diagnosis not present

## 2022-02-09 DIAGNOSIS — S93401A Sprain of unspecified ligament of right ankle, initial encounter: Secondary | ICD-10-CM | POA: Insufficient documentation

## 2022-02-09 DIAGNOSIS — Z79899 Other long term (current) drug therapy: Secondary | ICD-10-CM | POA: Insufficient documentation

## 2022-02-09 LAB — BASIC METABOLIC PANEL
Anion gap: 12 (ref 5–15)
BUN: 9 mg/dL (ref 6–20)
CO2: 21 mmol/L — ABNORMAL LOW (ref 22–32)
Calcium: 9.1 mg/dL (ref 8.9–10.3)
Chloride: 100 mmol/L (ref 98–111)
Creatinine, Ser: 1 mg/dL (ref 0.61–1.24)
GFR, Estimated: >60 mL/min (ref >60)
Glucose, Bld: 107 mg/dL — ABNORMAL HIGH (ref 70–99)
Potassium: 3.7 mmol/L (ref 3.5–5.1)
Sodium: 133 mmol/L — ABNORMAL LOW (ref 135–145)

## 2022-02-09 LAB — CBC WITH DIFFERENTIAL/PLATELET
Abs Immature Granulocytes: 0.03 10*3/uL (ref 0.00–0.07)
Basophils Absolute: 0 10*3/uL (ref 0.0–0.1)
Basophils Relative: 1 %
Eosinophils Absolute: 0 10*3/uL (ref 0.0–0.5)
Eosinophils Relative: 0 %
HCT: 41.4 % (ref 39.0–52.0)
Hemoglobin: 14.5 g/dL (ref 13.0–17.0)
Immature Granulocytes: 0 %
Lymphocytes Relative: 13 %
Lymphs Abs: 0.9 10*3/uL (ref 0.7–4.0)
MCH: 36 pg — ABNORMAL HIGH (ref 26.0–34.0)
MCHC: 35 g/dL (ref 30.0–36.0)
MCV: 102.7 fL — ABNORMAL HIGH (ref 80.0–100.0)
Monocytes Absolute: 0.4 10*3/uL (ref 0.1–1.0)
Monocytes Relative: 5 %
Neutro Abs: 5.5 10*3/uL (ref 1.7–7.7)
Neutrophils Relative %: 81 %
Platelets: 188 10*3/uL (ref 150–400)
RBC: 4.03 MIL/uL — ABNORMAL LOW (ref 4.22–5.81)
RDW: 11.4 % — ABNORMAL LOW (ref 11.5–15.5)
WBC: 6.9 10*3/uL (ref 4.0–10.5)
nRBC: 0 % (ref 0.0–0.2)

## 2022-02-09 LAB — ETHANOL: Alcohol, Ethyl (B): 71 mg/dL — ABNORMAL HIGH (ref <10)

## 2022-02-09 MED ORDER — HYDROCODONE-ACETAMINOPHEN 5-325 MG PO TABS
1.0000 | ORAL_TABLET | Freq: Once | ORAL | Status: AC
  Administered 2022-02-09: 1 via ORAL
  Filled 2022-02-09: qty 1

## 2022-02-09 MED ORDER — NAPROXEN 375 MG PO TABS: 375.0000 mg | ORAL_TABLET | Freq: Two times a day (BID) | ORAL | 0 refills | Status: DC

## 2022-02-09 MED ORDER — LIDOCAINE HCL (PF) 1 % IJ SOLN: 5.0000 mL | Freq: Once | INTRAMUSCULAR | Status: DC

## 2022-02-09 MED ORDER — LIDOCAINE HCL (PF) 1 % IJ SOLN
10.0000 mL | Freq: Once | INTRAMUSCULAR | Status: DC
  Filled 2022-02-09: qty 10
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
## 2022-02-09 NOTE — ED Notes (Addendum)
Pt has 0.6cm lac above right eye in eye brow area, 0.6cm on outer side of right eye, 1.3cm on right upper eye lid. Lacs are bleeding. Pt has 3+ swelling of right eye and it's ecchymotic. Pt has an area of 1+ swelling to right knee that is approx the size of a half dollar. Pt has 2+ swelling of right ankle. Pt has 2+ right pedal pulse, cap refill less than 3 sec, warm to touch, pt able to wiggle toes. ?

## 2022-02-09 NOTE — Discharge Instructions (Addendum)
Take the medications as needed for pain.  Apply ice to help with the swelling.  Use the crutches and splint for support but you can put weight on your ankle as tolerated. Sutures will need to be removed in 5 days.  Follow-up with a primary care doctor or urgent care doctor.  You can also return to the ED to have the stitches removed.  Apply antibiotic ointment to the wound daily. ? ?Consider following up with an orthopedic doctor if your ankle pain persists and does not improve in the next week ?

## 2022-02-09 NOTE — ED Provider Notes (Signed)
?..  Laceration Repair ? ?Date/Time: 02/09/2022 10:52 AM ?Performed by: Wynetta Fines, MD ?Authorized by: Dorie Rank, MD  ? ?Consent:  ?  Consent obtained:  Verbal ?  Consent given by:  Patient ?  Risks, benefits, and alternatives were discussed: yes   ?  Risks discussed:  Infection, pain, poor cosmetic result, need for additional repair and poor wound healing ?  Alternatives discussed:  No treatment and delayed treatment ?Universal protocol:  ?  Patient identity confirmed:  Verbally with patient, hospital-assigned identification number and arm band ?Anesthesia:  ?  Anesthesia method:  Local infiltration ?  Local anesthetic:  Lidocaine 1% w/o epi ?Laceration details:  ?  Location: R eyelid. ?  Length (cm):  2 ?Pre-procedure details:  ?  Preparation:  Patient was prepped and draped in usual sterile fashion ?Exploration:  ?  Hemostasis achieved with:  Direct pressure ?  Imaging outcome: foreign body not noted   ?  Contaminated: no   ?Treatment:  ?  Area cleansed with:  Povidone-iodine and saline ?  Amount of cleaning:  Standard ?  Irrigation solution:  Sterile saline ?  Irrigation volume:  20 ?  Irrigation method:  Pressure wash ?Skin repair:  ?  Repair method:  Sutures ?  Suture size:  6-0 ?  Suture material:  Prolene ?  Number of sutures:  9 ?Approximation:  ?  Approximation:  Loose ?Repair type:  ?  Repair type:  Simple ?Post-procedure details:  ?  Dressing:  Antibiotic ointment ?  Procedure completion:  Tolerated ?Comments:  ?   2 cm superior laceration closed with 7 sutures (6-0 prolene) ?1 cm inferior laceration closed with 2 sutures (6-0 prolene) ? ? ? ? ?Electronically signed by: Wynetta Fines, MD on 02/09/2022 at 9:08 AM ? ?  ?Wynetta Fines, MD ?02/09/22 1059 ? ?  ?Dorie Rank, MD ?02/09/22 1147 ? ?
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
## 2022-02-09 NOTE — Progress Notes (Signed)
Orthopedic Tech Progress Note ?Patient Details:  ?Cole Hebert ?[birth year] ?[id number] ? ?Ortho Devices ?Type of Ortho Device: Crutches, Ankle Air splint ?Ortho Device/Splint Location: RLE ?Ortho Device/Splint Interventions: Ordered, Application ?  ?Post Interventions ?Patient Tolerated: Well ? ?Valerie Fredin A Laquitta Dominski ?02/09/2022, 10:52 AM ? ?

## 2022-02-14 ENCOUNTER — Ambulatory Visit (HOSPITAL_COMMUNITY)
Admission: EM | Admit: 2022-02-14 | Discharge: 2022-02-14 | Disposition: A | Discharge status: Home / Self Care | Payer: Medicaid Other

## 2022-02-14 ENCOUNTER — Other Ambulatory Visit: Payer: Self-pay

## 2022-02-14 NOTE — ED Triage Notes (Addendum)
Patient presents here today for suture removal.  ?Patient denies having any problems with sutures. ?

## 2022-02-14 NOTE — ED Notes (Signed)
Nine sutures successfully removed.  ?

## 2022-02-14 NOTE — Discharge Instructions (Signed)
Please monitor for any signs of infection (pus, odor, increased redness). Please return if there are any openings to the site or if you have changes to your vision.  ?

## 2022-05-11 ENCOUNTER — Other Ambulatory Visit: Payer: Self-pay

## 2022-05-11 ENCOUNTER — Encounter (HOSPITAL_COMMUNITY): Payer: Self-pay | Admitting: Emergency Medicine

## 2022-05-11 ENCOUNTER — Emergency Department (HOSPITAL_COMMUNITY)
Admission: EM | Admit: 2022-05-11 | Discharge: 2022-05-11 | Disposition: A | Discharge status: Home / Self Care | Payer: Medicaid Other | Attending: Emergency Medicine | Admitting: Emergency Medicine

## 2022-05-11 ENCOUNTER — Emergency Department (HOSPITAL_COMMUNITY): Payer: Medicaid Other

## 2022-05-11 DIAGNOSIS — R8271 Bacteriuria: Secondary | ICD-10-CM | POA: Diagnosis not present

## 2022-05-11 DIAGNOSIS — R109 Unspecified abdominal pain: Secondary | ICD-10-CM | POA: Insufficient documentation

## 2022-05-11 DIAGNOSIS — R7401 Elevation of levels of liver transaminase levels: Secondary | ICD-10-CM | POA: Insufficient documentation

## 2022-05-11 DIAGNOSIS — R011 Cardiac murmur, unspecified: Secondary | ICD-10-CM | POA: Insufficient documentation

## 2022-05-11 DIAGNOSIS — R17 Unspecified jaundice: Secondary | ICD-10-CM

## 2022-05-11 DIAGNOSIS — R110 Nausea: Secondary | ICD-10-CM | POA: Diagnosis not present

## 2022-05-11 LAB — URINALYSIS, ROUTINE W REFLEX MICROSCOPIC
Bacteria, UA: NONE SEEN
Bilirubin Urine: NEGATIVE
Glucose, UA: NEGATIVE mg/dL
Hgb urine dipstick: NEGATIVE
Ketones, ur: 5 mg/dL — AB
Nitrite: NEGATIVE
Protein, ur: 100 mg/dL — AB
Specific Gravity, Urine: 1.033 — ABNORMAL HIGH (ref 1.005–1.030)
pH: 5 (ref 5.0–8.0)

## 2022-05-11 LAB — HEPATIC FUNCTION PANEL
ALT: 60 U/L — ABNORMAL HIGH (ref 0–44)
AST: 66 U/L — ABNORMAL HIGH (ref 15–41)
Albumin: 4.6 g/dL (ref 3.5–5.0)
Alkaline Phosphatase: 72 U/L (ref 38–126)
Bilirubin, Direct: 0.4 mg/dL — ABNORMAL HIGH (ref 0.0–0.2)
Indirect Bilirubin: 5.9 mg/dL — ABNORMAL HIGH (ref 0.3–0.9)
Total Bilirubin: 6.3 mg/dL — ABNORMAL HIGH (ref 0.3–1.2)
Total Protein: 7.3 g/dL (ref 6.5–8.1)

## 2022-05-11 LAB — BASIC METABOLIC PANEL
Anion gap: 13 (ref 5–15)
BUN: 15 mg/dL (ref 6–20)
CO2: 27 mmol/L (ref 22–32)
Calcium: 9.6 mg/dL (ref 8.9–10.3)
Chloride: 97 mmol/L — ABNORMAL LOW (ref 98–111)
Creatinine, Ser: 1.08 mg/dL (ref 0.61–1.24)
GFR, Estimated: >60 mL/min (ref >60)
Glucose, Bld: 124 mg/dL — ABNORMAL HIGH (ref 70–99)
Potassium: 3.3 mmol/L — ABNORMAL LOW (ref 3.5–5.1)
Sodium: 137 mmol/L (ref 135–145)

## 2022-05-11 LAB — CBC
HCT: 44.6 % (ref 39.0–52.0)
Hemoglobin: 15.7 g/dL (ref 13.0–17.0)
MCH: 36.6 pg — ABNORMAL HIGH (ref 26.0–34.0)
MCHC: 35.2 g/dL (ref 30.0–36.0)
MCV: 104 fL — ABNORMAL HIGH (ref 80.0–100.0)
Platelets: 194 10*3/uL (ref 150–400)
RBC: 4.29 MIL/uL (ref 4.22–5.81)
RDW: 11.9 % (ref 11.5–15.5)
WBC: 4.5 10*3/uL (ref 4.0–10.5)
nRBC: 0 % (ref 0.0–0.2)

## 2022-05-11 LAB — LIPASE, BLOOD: Lipase: 31 U/L (ref 11–51)

## 2022-05-11 MED ORDER — MORPHINE SULFATE (PF) 4 MG/ML IV SOLN
4.0000 mg | Freq: Once | INTRAVENOUS | Status: AC
  Administered 2022-05-11: 4 mg via INTRAVENOUS
  Filled 2022-05-11: qty 1

## 2022-05-11 MED ORDER — IOHEXOL 350 MG/ML SOLN
100.0000 mL | Freq: Once | INTRAVENOUS | Status: AC | PRN
  Administered 2022-05-11: 100 mL via INTRAVENOUS

## 2022-05-11 MED ORDER — ONDANSETRON HCL 4 MG/2ML IJ SOLN
4.0000 mg | Freq: Once | INTRAMUSCULAR | Status: AC
  Administered 2022-05-11: 4 mg via INTRAVENOUS
  Filled 2022-05-11: qty 2

## 2022-05-11 NOTE — Discharge Instructions (Signed)
Please call your doctor for recheck of your bilirubin levels a lot next week Return if you are having worsening symptoms especially pain, fever, or inability to tolerate fluids
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
## 2022-05-11 NOTE — ED Provider Notes (Signed)
[facility] EMERGENCY DEPARTMENT Provider Note   CSN: [id number] Arrival date & time: 05/11/22  0501     History  Chief Complaint  Patient presents with   Flank Pain    Vimal Bilderback is a 26 y.o. male.  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  Patient reports onset of right flank pain this past weekend.  It is nonradiating.  He has had some nausea without vomiting.  He reports dark urine but no dysuria or hematuria.  Denies fever.  He takes his normal home pain medications.  No known history of kidney stones.  Chart review.  Patient with complicated congenital heart history.  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 Home Medications Prior to Admission medications   Medication Sig Start Date End Date Taking? Authorizing Provider  amiodarone (PACERONE) 400 MG tablet Take 1 tablet (400 mg total) by mouth 2 (two) times daily. 07/02/21   Alma Friendly, MD  losartan (COZAAR) 25 MG tablet Take 0.5 tablets (12.5 mg total) by mouth daily. 07/03/21   Alma Friendly, MD  naproxen (NAPROSYN) 375 MG tablet Take 1 tablet (375 mg total) by mouth 2 (two) times daily. 02/09/22   Dorie Rank, MD      Allergies    Patient has no known allergies.    Review of Systems   Review of Systems  Constitutional:  Negative for fever.  Respiratory:  Negative for cough.   Cardiovascular:  Negative for chest pain.  Genitourinary:  Positive for flank pain.  All other systems reviewed and are negative.  Physical Exam Updated Vital Signs BP 120/79   Pulse 85   Temp 98.7 F (37.1 C) (Oral)   Resp 15   Ht 1.626 m (5\' 4" )   Wt 47.6 kg   SpO2 96%   BMI 18.02 kg/m  Physical Exam Vitals and nursing note reviewed.  Constitutional:      Appearance: He is well-developed. He is not ill-appearing.  HENT:     Head: Normocephalic and  atraumatic.     Nose: Nose normal.     Mouth/Throat:     Mouth: Mucous membranes are moist.  Eyes:     Pupils: Pupils are equal, round, and reactive to light.  Cardiovascular:     Rate and Rhythm: Normal rate and regular rhythm.     Heart sounds: Murmur heard.     Comments: Midsternal scar clean dry and intact Pulmonary:     Effort: Pulmonary effort is normal. No respiratory distress.     Breath sounds: Normal breath sounds. No wheezing.  Abdominal:     Palpations: Abdomen is soft.     Tenderness: There is abdominal tenderness. There is no rebound.     Comments: Right flank and abdominal tenderness to palpation, no rebound or guarding  Musculoskeletal:     Cervical back: Neck supple.  Lymphadenopathy:     Cervical: No cervical adenopathy.  Skin:    General: Skin is warm and dry.  Neurological:     Mental Status: He is alert and oriented to person, place, and time.  Psychiatric:        Mood and Affect: Mood normal.    ED Results / Procedures / Treatments   Labs (all labs ordered are listed, but only abnormal results are displayed) Labs Reviewed  URINALYSIS, ROUTINE W REFLEX MICROSCOPIC - Abnormal;  Notable for the following components:      Result Value   Color, Urine AMBER (*)    APPearance HAZY (*)    Specific Gravity, Urine 1.033 (*)    Ketones, ur 5 (*)    Protein, ur 100 (*)    Leukocytes,Ua TRACE (*)    All other components within normal limits  BASIC METABOLIC PANEL - Abnormal; Notable for the following components:   Potassium 3.3 (*)    Chloride 97 (*)    Glucose, Bld 124 (*)    All other components within normal limits  CBC - Abnormal; Notable for the following components:   MCV 104.0 (*)    MCH 36.6 (*)    All other components within normal limits  HEPATIC FUNCTION PANEL - Abnormal; Notable for the following components:   AST 66 (*)    ALT 60 (*)    Total Bilirubin 6.3 (*)    Bilirubin, Direct 0.4 (*)    Indirect Bilirubin 5.9 (*)    All other  components within normal limits  LIPASE, BLOOD    EKG None  Radiology No results found.  Procedures Procedures    Medications Ordered in ED Medications  morphine (PF) 4 MG/ML injection 4 mg (4 mg Intravenous Given 05/11/22 0610)  ondansetron (ZOFRAN) injection 4 mg (4 mg Intravenous Given 05/11/22 [id number])    ED Course/ Medical Decision Making/ A&P                           Medical Decision Making Amount and/or Complexity of Data Reviewed Labs: ordered. Radiology: ordered.  Risk Prescription drug management.   This patient presents to the ED for concern of right flank and abdominal pain, this involves an extensive number of treatment options, and is a complaint that carries with it a high risk of complications and morbidity.  I considered the following differential and admission for this acute, potentially life threatening condition.  The differential diagnosis includes kidney stone, pyelonephritis, cholecystitis, appendicitis, less likely colitis  MDM:    This is a 25 year old male with a history of congenital heart disease who presents with right flank and abdominal pain.  He is nontoxic.  Vital signs are reassuring.  He has some tender Ness in the right abdomen and right flank.  Labs obtained.  Slightly elevated LFTs mostly elevated indirect bilirubin which is consistent with prior lab work noted in chart.  Lipase normal.  Urinalysis with some white cells but no bacteria.  Not consistent with UTI.  BMP and CBC are reassuring.  Will obtain CT abdomen pelvis to further evaluate.  (Labs, imaging, consults)  Labs: I Ordered, and personally interpreted labs.  The pertinent results include: CBC, BMP, hepatic function panel, lipase  Imaging Studies ordered: I ordered imaging studies including CT abdomen pelvis I independently visualized and interpreted imaging. I agree with the radiologist interpretation  Additional history obtained from family at bedside.  External records from  outside source obtained and reviewed including outside records from [facility]  Cardiac Monitoring: The patient was maintained on a cardiac monitor.  I personally viewed and interpreted the cardiac monitored which showed an underlying rhythm of: Normal sinus rhythm  Reevaluation: After the interventions noted above, I reevaluated the patient and found that they have :improved  Social Determinants of Health: Lives independently  Disposition: Pending patient signed out to oncoming provider.  Co morbidities that complicate the patient evaluation  Past Medical History:  Diagnosis Date  Congenital heart defect      Medicines Meds ordered this encounter  Medications   morphine (PF) 4 MG/ML injection 4 mg   ondansetron (ZOFRAN) injection 4 mg    I have reviewed the patients home medicines and have made adjustments as needed  Problem List / ED Course: Problem List Items Addressed This Visit   None               Final Clinical Impression(s) / ED Diagnoses Final diagnoses:  None    Rx / DC Orders ED Discharge Orders     None         Merryl Hacker, MD 05/11/22 [phone]

## 2022-05-11 NOTE — ED Triage Notes (Addendum)
Presents for R flank pain and dark urine since yesterday, has become worse.   Denies blood in urine, testicle pain, abd pain, N/V/D, dysuria, penile discharge.  No previous episodes.

## 2022-06-30 ENCOUNTER — Ambulatory Visit (HOSPITAL_COMMUNITY)
Admission: EM | Admit: 2022-06-30 | Discharge: 2022-06-30 | Disposition: A | Discharge status: Home / Self Care | Payer: Medicaid Other | Attending: Psychiatry | Admitting: Psychiatry

## 2022-06-30 DIAGNOSIS — F320 Major depressive disorder, single episode, mild: Secondary | ICD-10-CM

## 2022-06-30 DIAGNOSIS — F329 Major depressive disorder, single episode, unspecified: Secondary | ICD-10-CM | POA: Insufficient documentation

## 2022-06-30 DIAGNOSIS — T1490XA Injury, unspecified, initial encounter: Secondary | ICD-10-CM

## 2022-06-30 DIAGNOSIS — Z62810 Personal history of physical and sexual abuse in childhood: Secondary | ICD-10-CM | POA: Insufficient documentation
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
## 2022-06-30 NOTE — Discharge Instructions (Addendum)
Take all medications as prescribed. Keep all follow-up appointments as scheduled.  Do not consume alcohol or use illegal drugs while on prescription medications. Report any adverse effects from your medications to your primary care provider promptly.  In the event of recurrent symptoms or worsening symptoms, call 911, a crisis hotline, or go to the nearest emergency department for evaluation.   

## 2022-08-11 LAB — POCT GLYCOSYLATED HEMOGLOBIN (HGB A1C): Hemoglobin-A1c: 4.9

## 2022-08-11 LAB — GLUCOSE, POCT (MANUAL RESULT ENTRY): POC Glucose: 167 mg/dl — AB (ref 70–99)
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
## 2023-01-28 IMAGING — CT CT ABD-PELV W/ CM
2 of 4 series · 17 of 46 positions shown, 19 images · IV contrast (Omni 300)
Comparison: None Available.

CLINICAL DATA: Acute right-sided abdominal pain.

EXAM:
CT ABDOMEN AND PELVIS WITH CONTRAST
TECHNIQUE: Multidetector CT imaging of the abdomen and pelvis was performed
using the standard protocol following bolus administration of
intravenous contrast.

[Series 3: a/p w/ 5mm · axial · 0.60mm/px · z∈[+923,+1303]mm · 14 of 84 slices shown, 16 images]
[im 4/84  soft-tissue]
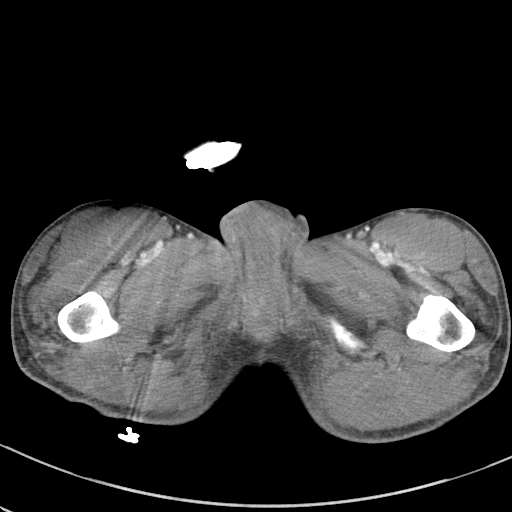
[im 4/84  bone]
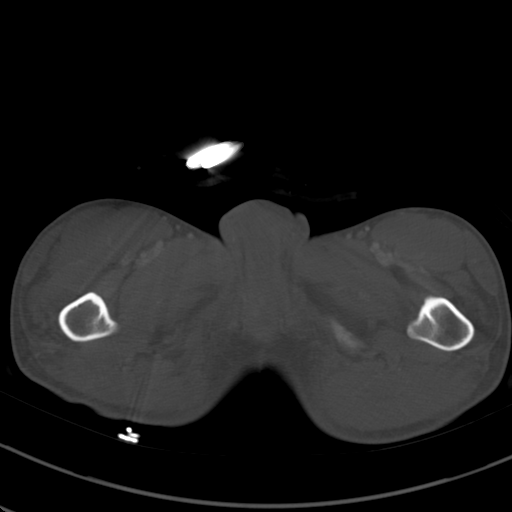
[im 11/84  soft-tissue]
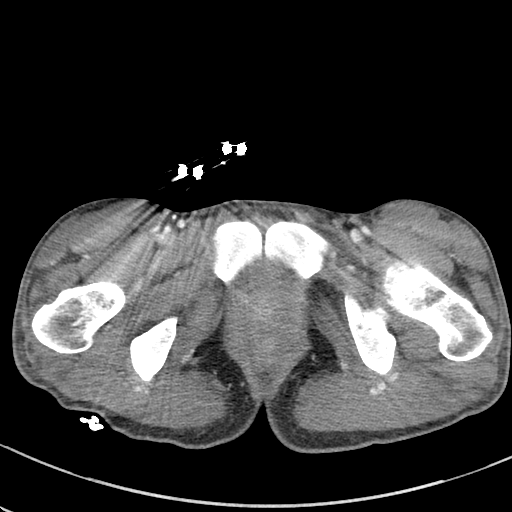
[im 18/84  soft-tissue]
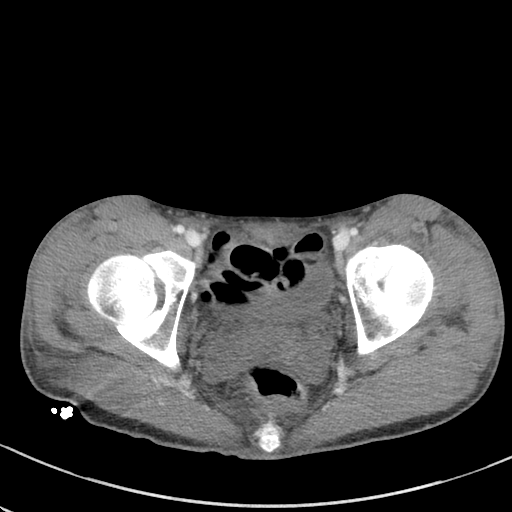
[im 21/84  soft-tissue]
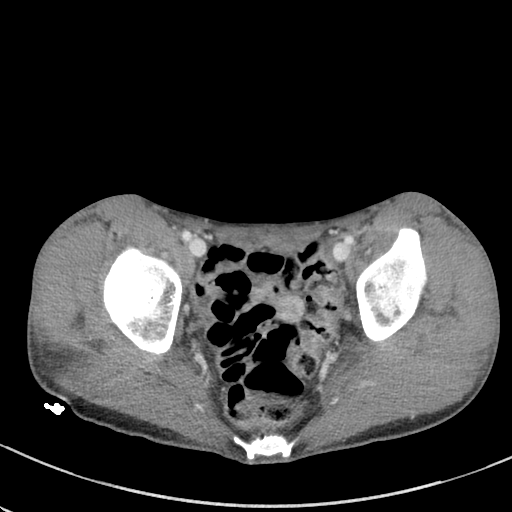
[im 28/84  soft-tissue]
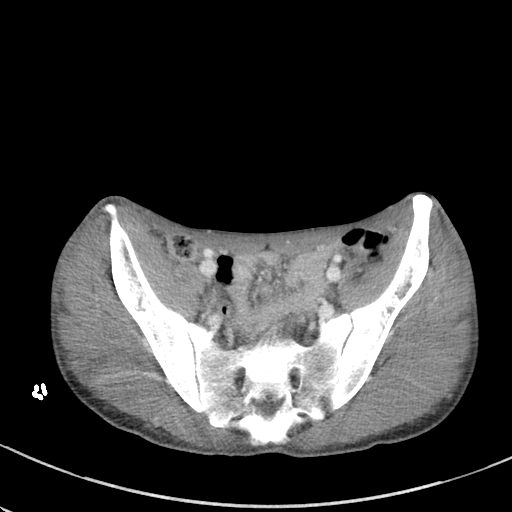
[im 35/84  soft-tissue]
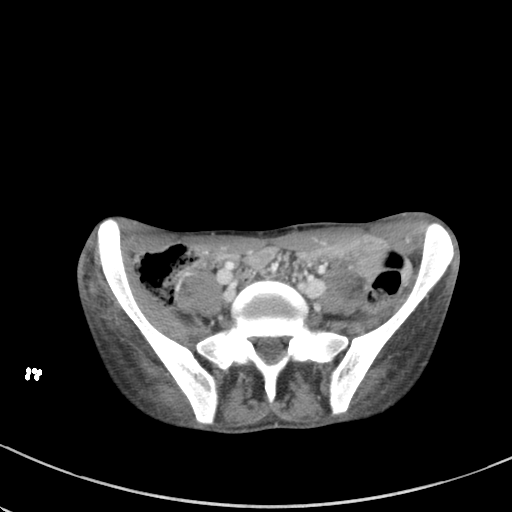
[im 39/84  soft-tissue]
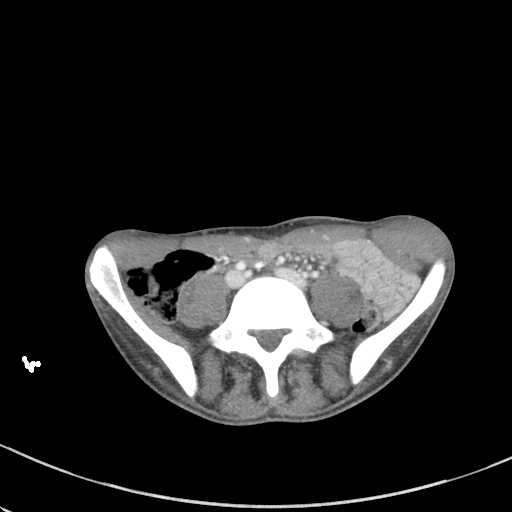
[im 45/84  soft-tissue]
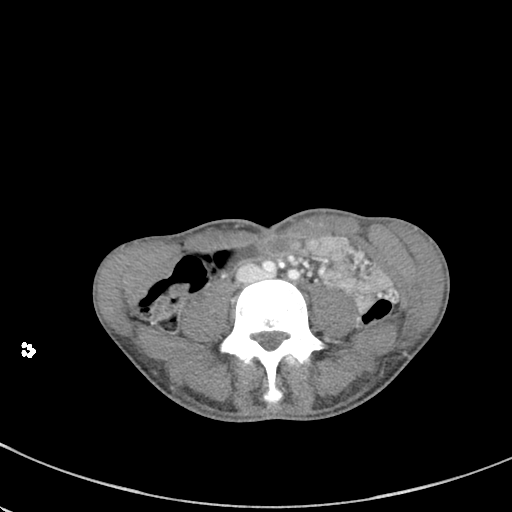
[im 49/84  soft-tissue]
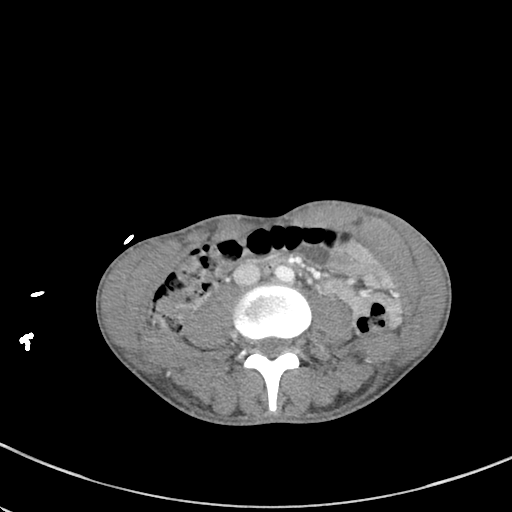
[im 49/84  bone]
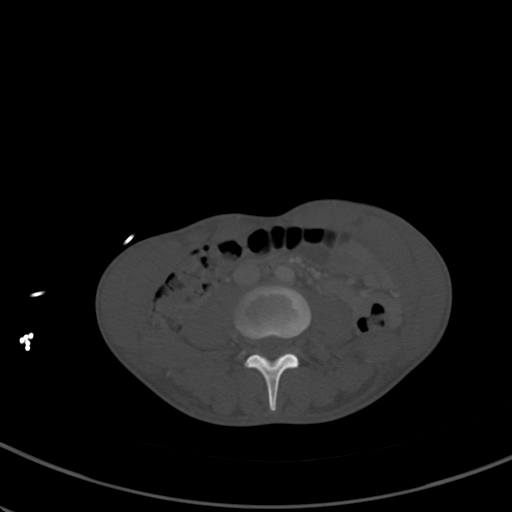
[im 56/84  soft-tissue]
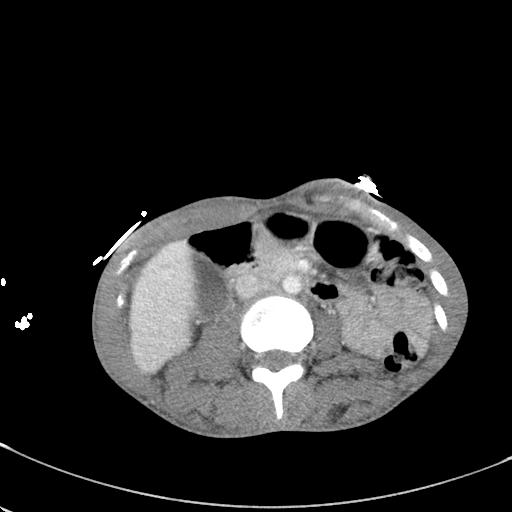
[im 63/84  soft-tissue]
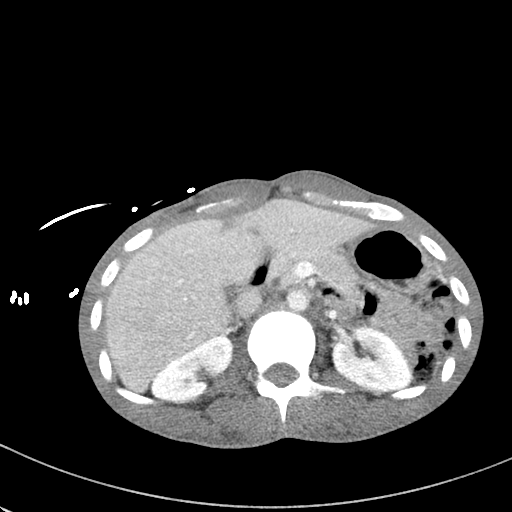
[im 66/84  soft-tissue]
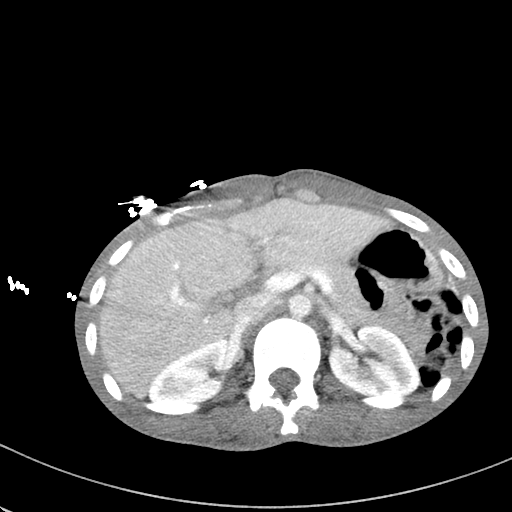
[im 73/84  soft-tissue]
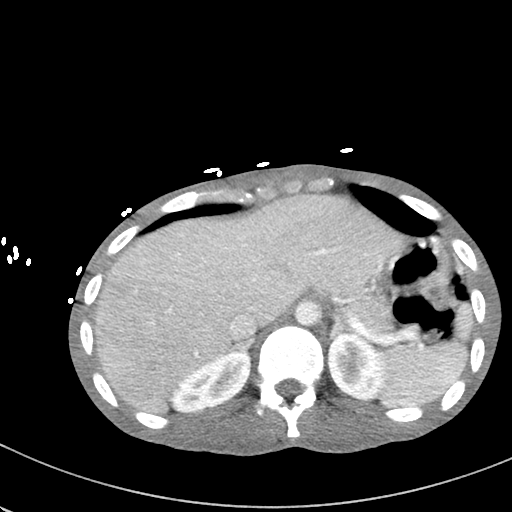
[im 80/84  soft-tissue]
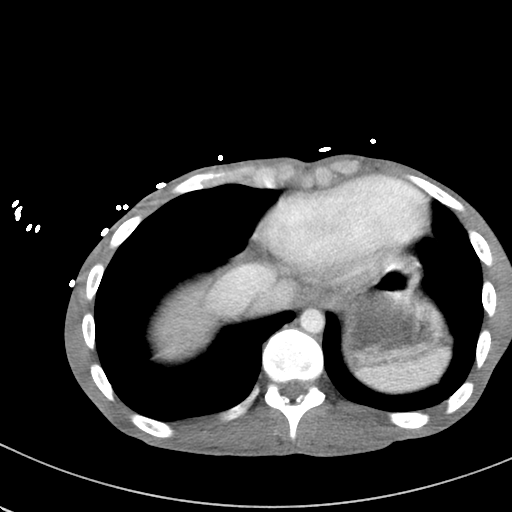

[Series 6: a/p w/ cor · coronal · 0.79mm/px · 3 of 102 slices shown]
[im 34/102  soft-tissue]
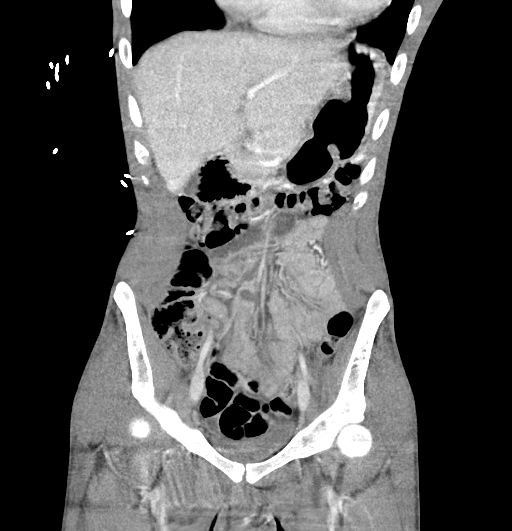
[im 45/102  soft-tissue]
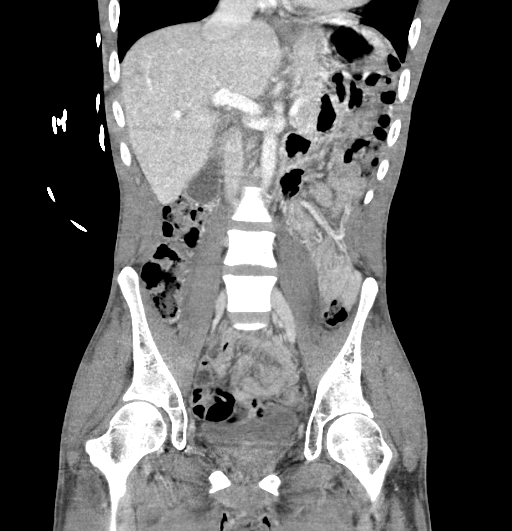
[im 57/102  soft-tissue]
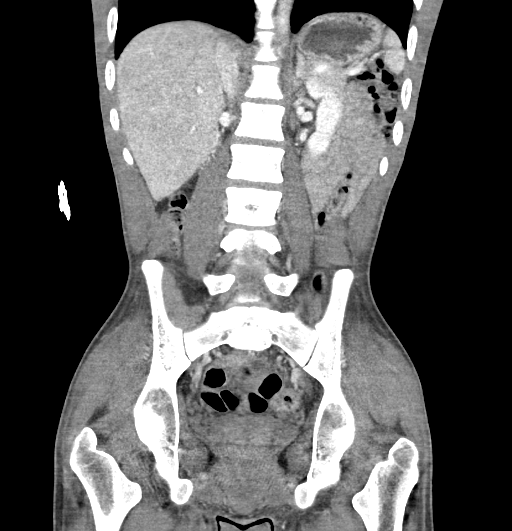

[17 of 46 positions shown; findings below may reference images not displayed]

RADIATION DOSE REDUCTION: This exam was performed according to the
departmental dose-optimization program which includes automated
exposure control, adjustment of the mA and/or kV according to
patient size and/or use of iterative reconstruction technique.

CONTRAST:  100mL OMNIPAQUE IOHEXOL 350 MG/ML SOLN
FINDINGS: Lower chest: No acute abnormality.

Hepatobiliary: No focal liver abnormality is seen. No gallstones,
gallbladder wall thickening, or biliary dilatation.

Pancreas: Unremarkable. No pancreatic ductal dilatation or
surrounding inflammatory changes.

Spleen: Normal in size without focal abnormality.

Adrenals/Urinary Tract: Adrenal glands are unremarkable. Kidneys are
normal, without renal calculi, focal lesion, or hydronephrosis.
Bladder is unremarkable.

Stomach/Bowel: Stomach is within normal limits. Appendix appears
normal. No evidence of bowel wall thickening, distention, or
inflammatory changes.

Vascular/Lymphatic: No significant vascular findings are present. No
enlarged abdominal or pelvic lymph nodes.

Reproductive: Prostate is unremarkable.

Other: No abdominal wall hernia or abnormality. No abdominopelvic
ascites.

Musculoskeletal: No acute or significant osseous findings.
IMPRESSION: No definite abnormality seen in the abdomen or pelvis.
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
## 2023-12-19 ENCOUNTER — Ambulatory Visit: Payer: Medicaid Other | Attending: Internal Medicine | Admitting: Internal Medicine

## 2024-05-03 ENCOUNTER — Emergency Department (HOSPITAL_COMMUNITY): Payer: MEDICAID

## 2024-05-03 ENCOUNTER — Emergency Department (HOSPITAL_COMMUNITY)
Admission: EM | Admit: 2024-05-03 | Discharge: 2024-05-04 | Disposition: A | Discharge status: Other Acute Inpatient Hospital | Payer: MEDICAID | Attending: Emergency Medicine | Admitting: Emergency Medicine

## 2024-05-03 ENCOUNTER — Encounter (HOSPITAL_COMMUNITY): Payer: Self-pay

## 2024-05-03 ENCOUNTER — Other Ambulatory Visit: Payer: Self-pay

## 2024-05-03 DIAGNOSIS — R079 Chest pain, unspecified: Secondary | ICD-10-CM | POA: Diagnosis present

## 2024-05-03 DIAGNOSIS — R002 Palpitations: Secondary | ICD-10-CM | POA: Insufficient documentation

## 2024-05-03 DIAGNOSIS — R0602 Shortness of breath: Secondary | ICD-10-CM | POA: Insufficient documentation

## 2024-05-03 DIAGNOSIS — E876 Hypokalemia: Secondary | ICD-10-CM | POA: Insufficient documentation

## 2024-05-03 DIAGNOSIS — I509 Heart failure, unspecified: Secondary | ICD-10-CM | POA: Insufficient documentation

## 2024-05-03 LAB — CBC
HCT: 44.4 % (ref 39.0–52.0)
Hemoglobin: 14.9 g/dL (ref 13.0–17.0)
MCH: 35.3 pg — ABNORMAL HIGH (ref 26.0–34.0)
MCHC: 33.6 g/dL (ref 30.0–36.0)
MCV: 105.2 fL — ABNORMAL HIGH (ref 80.0–100.0)
Platelets: 185 10*3/uL (ref 150–400)
RBC: 4.22 MIL/uL (ref 4.22–5.81)
RDW: 11.9 % (ref 11.5–15.5)
WBC: 5 10*3/uL (ref 4.0–10.5)
nRBC: 0 % (ref 0.0–0.2)

## 2024-05-03 LAB — BASIC METABOLIC PANEL WITH GFR
Anion gap: 10 (ref 5–15)
BUN: 15 mg/dL (ref 6–20)
CO2: 24 mmol/L (ref 22–32)
Calcium: 9.4 mg/dL (ref 8.9–10.3)
Chloride: 105 mmol/L (ref 98–111)
Creatinine, Ser: 0.99 mg/dL (ref 0.61–1.24)
GFR, Estimated: >60 mL/min (ref >60)
Glucose, Bld: 109 mg/dL — ABNORMAL HIGH (ref 70–99)
Potassium: 3.3 mmol/L — ABNORMAL LOW (ref 3.5–5.1)
Sodium: 139 mmol/L (ref 135–145)

## 2024-05-03 LAB — TROPONIN I (HIGH SENSITIVITY): Troponin I (High Sensitivity): 65 ng/L — ABNORMAL HIGH (ref <18)

## 2024-05-03 NOTE — ED Triage Notes (Signed)
 Pt complaining of palpitations for a few days but today has been the worse. Said he has not had his medication in a few weeks,(furosemide , spironolactione, asa,ect.)

## 2024-05-04 DIAGNOSIS — R002 Palpitations: Secondary | ICD-10-CM | POA: Diagnosis not present

## 2024-05-04 DIAGNOSIS — I509 Heart failure, unspecified: Secondary | ICD-10-CM | POA: Diagnosis not present

## 2024-05-04 DIAGNOSIS — R079 Chest pain, unspecified: Secondary | ICD-10-CM | POA: Diagnosis present

## 2024-05-04 DIAGNOSIS — R0602 Shortness of breath: Secondary | ICD-10-CM | POA: Diagnosis not present

## 2024-05-04 DIAGNOSIS — E876 Hypokalemia: Secondary | ICD-10-CM | POA: Diagnosis not present

## 2024-05-04 LAB — BRAIN NATRIURETIC PEPTIDE: B Natriuretic Peptide: 65.1 pg/mL (ref 0.0–100.0)

## 2024-05-04 LAB — TROPONIN I (HIGH SENSITIVITY): Troponin I (High Sensitivity): 238 ng/L (ref <18)

## 2024-05-04 MED ORDER — ASPIRIN 81 MG PO CHEW
324.0000 mg | CHEWABLE_TABLET | Freq: Once | ORAL | Status: AC
  Administered 2024-05-04: 324 mg via ORAL
  Filled 2024-05-04: qty 4

## 2024-05-04 NOTE — ED Provider Notes (Signed)
 [facility] DEPT [facility] Emergency Department Provider Note MRN:  [id number]  Arrival date & time: 05/04/24     Chief Complaint   Palpitations   History of Present Illness   Cole Hebert is a 28 y.o. year-old male presents to the ED with chief complaint of chest pain and SOB.  Symptoms have been gradually worsening over the past 3 weeks.  States that he has been out of his furosemide , spironolactone , amiodarone , losartan , and ASA. Has had lack of energy.  Reports palpitations.     Hx of TOF, CHF, a-flutter.  History provided by patient.   Review of Systems  Pertinent positive and negative review of systems noted in HPI.    Physical Exam   Vitals:   05/03/24 2247 05/04/24 0104  BP:  116/73  Pulse:  81  Resp:  (!) 22  Temp: 98.2 F (36.8 C) 97.6 F (36.4 C)  SpO2:  100%    CONSTITUTIONAL:  non toxic-appearing, NAD NEURO:  Alert and oriented x 3, CN 3-12 grossly intact EYES:  eyes equal and reactive ENT/NECK:  Supple, no stridor  CARDIO:  normal rate, regular rhythm, appears well-perfused  PULM:  No respiratory distress, CTAB GI/GU:  non-distended,  MSK/SPINE:  No gross deformities, no edema, moves all extremities  SKIN:  no rash, atraumatic   *Additional and/or pertinent findings included in MDM below  Diagnostic and Interventional Summary    EKG Interpretation Date/Time:  Sunday May 03 2024 22:41:40 EDT Ventricular Rate:  100 PR Interval:  224 QRS Duration:  158 QT Interval:  382 QTC Calculation: 492 R Axis:   209  Text Interpretation: Sinus rhythm with 1st degree A-V block Right bundle branch block , plus right ventricular hypertrophy Inferior infarct , age undetermined Abnormal ECG Confirmed by Kelsey Patricia [phone]) on 05/04/2024 2:09:36 AM       Labs Reviewed  BASIC METABOLIC PANEL WITH GFR - Abnormal; Notable for the following components:      Result Value   Potassium 3.3 (*)    Glucose, Bld 109 (*)    All other components within  normal limits  CBC - Abnormal; Notable for the following components:   MCV 105.2 (*)    MCH 35.3 (*)    All other components within normal limits  TROPONIN I (HIGH SENSITIVITY) - Abnormal; Notable for the following components:   Troponin I (High Sensitivity) 65 (*)    All other components within normal limits  TROPONIN I (HIGH SENSITIVITY) - Abnormal; Notable for the following components:   Troponin I (High Sensitivity) 238 (*)    All other components within normal limits  BRAIN NATRIURETIC PEPTIDE    DG Chest 2 View  Final Result      Medications  aspirin chewable tablet 324 mg (324 mg Oral Given 05/04/24 0206)     Procedures  /  Critical Care .Critical Care  Performed by: Sherel Dikes, PA-C Authorized by: Sherel Dikes, PA-C   Critical care provider statement:    Critical care time (minutes):  58   Critical care was necessary to treat or prevent imminent or life-threatening deterioration of the following conditions:  Circulatory failure   Critical care was time spent personally by me on the following activities:  Development of treatment plan with patient or surrogate, discussions with consultants, evaluation of patient's response to treatment, examination of patient, ordering and review of laboratory studies, ordering and review of radiographic studies, ordering and performing treatments and interventions, pulse oximetry, re-evaluation of patient's condition and  review of old charts   ED Course and Medical Decision Making  I have reviewed the triage vital signs, the nursing notes, and pertinent available records from the EMR.  Social Determinants Affecting Complexity of Care: Patient has no clinically significant social determinants affecting this chief complaint..   ED Course: Clinical Course as of 05/04/24 0329  Mon May 04, 2024  0329 Troponin I (High Sensitivity)(!!) Increased from 265-238, will consult with cardiology [RB]  0329 CBC(!) No leukocytosis or  anemia [RB]  0329 Brain natriuretic peptide BNP is normal at 65 [RB]  0329 Basic metabolic panel(!) Mild hypokalemia at 3.3 [RB]    Clinical Course User Index [RB] Sherel Dikes, PA-C    Medical Decision Making Patient here with worsening shortness of breath, chest pain, and palpitations for the past couple weeks.  Today he is noted to have elevated troponin in triage at 65, will recheck.  He still complains of some shortness of breath symptoms.  He has no significant evidence of volume overload.  His repeat troponin is 238, will consult with cardiology.  Amount and/or Complexity of Data Reviewed Labs: ordered. Radiology: ordered.  Risk OTC drugs.         Consultants: I consulted with Dr. Sanjuana Crutch, cardiology, who recommends transfer to [facility]. I consulted with Dr. Galani, from [facility], who accepts in transfer.  Treatment and Plan: Patient's exam and diagnostic results are concerning for ACS.  Feel that patient will need admission to the hospital for further treatment and evaluation.    Final Clinical Impressions(s) / ED Diagnoses     ICD-10-CM   1. Chest pain, unspecified type  R07.9       ED Discharge Orders     None         Discharge Instructions Discussed with and Provided to Patient:   Discharge Instructions   None      Sherel Dikes, PA-C 05/04/24 0329    Kelsey Patricia, MD 05/04/24 [phone]

## 2024-05-04 NOTE — Consult Note (Addendum)
    Patient Profile:   Cole Hebert is a 28 y.o. male with tetralogy of Fallot with 2 prior repairs of his RV to PA conduit most recently in January 2021 with a homograft with resultant pulmonic stenosis, moderate AR due to aortic root dilatation  History of Present Illness:   Cole Hebert presents to the ED for shortness of breath. He states that he is barely able to walk or move without being limited by his symptoms. He has had problems the last 2 weeks obtaining his medication due to insurance issues. He presents today to obtain his medications and to help alleviate his exercise intolerance.  Regarding his cardiac history, Cole Hebert was born in [birth year] with tetralogy of Fallot. He obtained a BT shunt in 05-14-1998and a subsequent VSD patch closure with the insertion of a 14 mm RV to PA conduit in June 1999.  He unfortunately has had multiple episodes of pulmonary conduit stenosis requiring the replacement of the pulmonary conduit in June 2007 and in 2021 (homograft).  Since his redo pulmonary conduit in 2021 he has had worsening heart failure symptoms in the setting of having worsening pulmonic stenosis. Multiple balloon angioplasties of the pulmonic valve. He was referred to [facility] pediatric cardiothoracic surgery  for consideration for redo pulmonic valve. He was seen by Dr. Milon Aloe the pediatric cardiothoracic surgeon.  He was scheduled for repeat median sternotomy, replacement of RV to PA conduit, and PA plasty in January 2025 however surgery had to be postponed due requiring dental optimization prior to surgery.  His most recent echo was in April 2025 to which she had moderate pulmonic stenosis with a peak gradient of 44, RVSP of 68, moderate aortic regurgitation, grade 3 diastolic dysfunction with LVEF of 50%.  --- Cole Hebert has moderate pulmonic stenosis with exercise intolerance and he has continued to have symptoms despite balloon valvulotomy in 2023. Discussed with the emergency department  that Cole Hebert should be transferred to a tertiary center for definitive management. [facility] has excepted Cole Hebert for transfer.   Velvet Gibbs, MD MS  Overnight Cardiology [facility]

## 2024-05-04 NOTE — ED Notes (Signed)
 Patient put on both list for transport [facility] & [facility], both said trucks wouldn't be available until day shift.

## 2024-05-04 NOTE — ED Notes (Signed)
 Report given to hazel of [facility] transport. Pt leaving with [facility] in no new onset distress at this time.

## 2024-09-11 NOTE — Discharge Summary (Signed)
 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 male who presents for preop eval in preparation to undergo Redo sternotomy, replacement of RV-PA conduit vs bioprosthetic pulmonary valve implantation with RVOT patch reconstruction. Possible peripheral cardiopulmonary bypass  for known severe narrowing and insufficiency of his RV-PA conduit  in Cole setting of NHYA class II sxs.    He has a pmhx notable for aortic root dilation, Aflutter, HFrEF (recovered EF Of 50%), hx TOF, HTN, PA atresia. - s/p BT shunt (8 Jun 98) - s/p TOF repair - patch closure VSD, 14 mm RV-PA conduit (17 Jun 99) (required a period of ECMO for presumed sepsis and possible endocarditis) - s/p replacement of pulmonary valve (24 mm conduit), closure of residual VSD (18 Jun 07) - s/p replacement of pulmonary valve (28 mm pulmonary homograft) [[place] 12/22/19] - s/p Atrial Flutter ablation Hank 07/10/21)    He currently report sxs of CP, dizziness, palpitations, fatigue and SOB.  He denies syncope.  Past Medical History:  Diagnosis Date  . Aortic root dilatation () 08/29/2018  . Atrial flutter (CMS/HHS-HCC) 06/30/2021  . HFrEF (heart  failure with reduced ejection fraction) (CMS/HHS-HCC) 07/20/2022  . History of tetralogy of Fallot   . Hypertension   . Pulmonary artery atresia (HHS-HCC)   . 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 S/P TOF (tetralogy of Fallot) repair    Past Surgical History:  Procedure Laterality Date  . Tetrology of Fallot Repair  05/26/1998  . REPLACEMENT PULMONARY VALVE  05/2006  . REPLACEMENT PULMONARY VALVE  12/2019   Redo PVR  . ABLATION ARRYTHMIA FOCUS  07/2020   Atrial flutter ablation  . 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 Mutiple time redo sternotomy.;  Surgeon: Rude Randine Charlies, MD;  Location: [facility];  Service: Cardiothoracic;  Laterality: N/A;  . REPAIR PULMONARY ARTERY STENOSIS N/A 09/10/2024   Procedure: REPAIR OF PULMONARY ARTERY STENOSIS BY RECONSTRUCTION WITH PATCH OR GRAFT;  Surgeon: Rude Randine Charlies, MD;  Location: [facility];  Service: Cardiothoracic;  Laterality: N/A;  . REOPERATION ON HEART N/A 09/10/2024   Procedure: REOPERATION, CORONARY ARTERY BYPASS PROCEDURE OR VALVE PROCEDURE, MORE THAN 1 MONTH AFTER ORIGINAL OPERATION (LIST IN ADDITION TO PRIMARY PROCEDURE), Multiple time redo sternotomy - s/p BT shunt January 12, 1997, s/p TOF repair 05/26/98, s/p PVR 05/27/06, s/p PVR 12/22/19;  Surgeon: Rude Randine Charlies, MD;  Location: [facility];  Service: Cardiothoracic;  Laterality: N/A;  . EXPOSURE AXILLARY/SUBCLAVIAN ARTERY FOR DELIVERY AORTIC ENDOVASCULAR PROSTHESIS  Left 09/10/2024   Procedure: OPEN AXILLARY/SUBCLAVIAN ARTERY EXPOSURE FOR DELIVERY OF ENDOVASCULAR PROSTHESIS BY INFRACLAVICULAR OR SUPRACLAVICULAR INCISION, UNILATERAL;   Surgeon: Olinda Randine Fuller, MD;  Location: [facility];  Service: Cardiothoracic;  Laterality: Left;   Family History  Problem Relation Name Age of Onset  . High blood pressure (Hypertension) Mother    . Diabetes type II Mother    . Huntington's disease Mother    . Asthma Sister    . Huntington's disease Maternal Grandmother    . Stomach cancer Maternal Grandfather    . Brain cancer Maternal Aunt    . Heart disease Maternal Aunt    . Anesthesia problems Neg Hx    . Malignant hypertension Neg Hx    . Malignant hyperthermia Neg Hx    . Pseudochol deficiency Neg Hx    . PONV Neg Hx     Social History   Socioeconomic History  . Marital status: Single  . Number of children: 3  . Years of education: 73  . Highest education level: High school graduate  Occupational History  . Occupation: Unemployed  Tobacco Use  . Smoking status: Never    Passive exposure: Never  . Smokeless tobacco: Never  Vaping Use  . Vaping status: Former  Substance and Sexual Activity  . Alcohol use: Not Currently    Alcohol/week: 6.0 standard drinks of alcohol    Types: 2 Cans of beer, 4 Drinks containing 0.5 oz of alcohol per week  . Drug use: Not Currently    Frequency: 1.0 times per week    Types: Marijuana  . Sexual activity: Not Currently    Partners: Female    Birth control/protection: Condom   Social Drivers of Health   Financial Resource Strain: High Risk (05/04/2024)   Overall Financial Resource Strain (CARDIA)   . Difficulty of Paying Living Expenses: Hard  Food Insecurity: Food Insecurity Present (05/04/2024)   Hunger Vital Sign   . Worried About Programme researcher, broadcasting/film/video in [place] Last Year: Sometimes true   . Ran Out of Food in [place] Last Year: Sometimes true  Transportation Needs: No Transportation Needs (09/10/2024)   PRAPARE - Transportation   . Lack of Transportation (Medical): No   . Lack of Transportation (Non-Medical): No   No Known Allergies Prior to Admission Medications   Prescriptions Last Dose Taking?  ARIPiprazole (ABILIFY) 2 MG tablet  No  Sig: Take 1 tablet (2 mg total) by mouth once daily for 120 days  aspirin  81 MG EC tablet 09/09/2024 Yes  Sig: Take 1 tablet (81 mg total) by mouth once daily  cholecalciferol (VITAMIN D3) 1000 unit tablet Past Month Yes  Sig: Take 1 tablet (1,000 Units total) by mouth once daily  ferrous sulfate 325 (65 FE) MG tablet 09/09/2024 Yes  Sig: Take 1 tablet (325 mg total) by mouth every Monday, Wednesday, and Friday    Facility-Administered Medications: None   Hospital Course: [place] Hebert is a 28 y.o. male who admitted to [facility] to undergo surgical intervention for his cardiothoracic diagnosis of pulmonary artery stenosis.  Cole patient was taken to [place] operating room by Geoffrion, Randine Fuller, *, who performed Cole above procedure(s).  Overall, Cole patient tolerated Cole procedure well without significant difficulty or evident complication, and was transferred to Cole cardiothoracic surgical intensive care unit.   In Cole immediate postoperative period, he continued to do progress well.  Ultimately, he was transferred to Cole step-down unit for Cole remainder of  his hospital course.     Cole hospital course was unremarkable.  Rhythm at Cole time of discharge was normal sinus rhythm.   After discussion with Cole case manager, patient and family; Cole decision was made to have patient discharge to home. Once follow-up arrangements were confirmed, Cole patient was felt suitable for discharge to home on 09/14/2024.  Discharge Disposition: Home  Discharge Exam (taken from progress note on 10/5):  BP 123/62 (BP Location: Right upper arm, Patient Position: Lying, BP Cuff Size: Small Adult)   Pulse 103   Temp 36.8 C (98.2 F) (Oral)   Resp 20   Ht 165.1 cm (5' 5)   Wt 53.3 kg (117 lb 8.1 oz)   SpO2 95%   BMI 19.55 kg/m  Neurologic: alert and oriented x 4, moves all extremities and follows  commands Cardiac: regular rate and rhythm, ST 100-120s on monitor  Respiratory: normal work of breathing on room air  Abdomen: soft, nontender and nondistended Extremities: pulses 2+ bilaterally, upper and lower extremities and no cyanosis, clubbing or peripheral edema Incisions: Midsternal incision clean, dry and intact. Left axillary clean, dry and intact. Bilateral groins clean and dry.  Lines: all central lines removed         Discharge Medications     New Medications      Details  acetaminophen  325 MG tablet Commonly known as: TYLENOL   975 mg, Oral, Every 6 hours Refills: 0   FUROsemide  20 MG tablet Commonly known as: LASIX   20 mg, Oral, 2 times Daily Quantity: 60 tablet Refills: 0   guaiFENesin  600 mg SR tablet Commonly known as: MUCINEX   600 mg, Oral, 2 times Daily Refills: 0   losartan  25 MG tablet Commonly known as: COZAAR  Start taking on: September 15, 2024  25 mg, Oral, Daily Quantity: 30 tablet Refills: 0   methocarbamoL 500 MG tablet Commonly known as: ROBAXIN  500 mg, Oral, 3 times Daily PRN Quantity: 30 tablet Refills: 0   oxyCODONE 5 MG immediate release tablet Commonly known as: ROXICODONE  5 mg, Oral, Every 6 hours PRN Quantity: 28 tablet Refills: 0   polyethylene glycol packet Commonly known as: MIRALAX   17 g, Oral, 2 times Daily, Mix in 4-8ounces of fluid prior to taking. Refills: 0   sennosides-docusate 8.6-50 mg tablet Commonly known as: SENOKOT-S  2 tablets, Oral, 2 times Daily Refills: 0       Medications To Continue      Details  ARIPiprazole 2 MG tablet Commonly known as: ABILIFY  2 mg, Oral, Daily Quantity: 30 tablet Refills: 3   aspirin  81 MG EC tablet  81 mg, Oral, Daily Quantity: 90 tablet Refills: 3   cholecalciferol 1000 unit tablet Commonly known as: VITAMIN D3  1,000 Units, Oral, Daily Quantity: 30 tablet Refills: 3   ferrous sulfate 325 (65 FE) MG tablet  325 mg, Oral, Every Monday, Wednesday,  Friday Quantity: 30 tablet Refills: 3   spironolactone  25 MG tablet Commonly known as: ALDACTONE   12.5 mg, Oral, Daily Quantity: 30 tablet Refills: 0         Antiplatelets:  prescribed  Statins/Antilipid Agent(s):  not indicated  Beta Blocker: not indicated  ACE Inhibitor/ARB:  prescribed  Anticoagulation:  Not indicated   DISCHARGE INSTRUCTIONS:   Antibiotic Prophylaxis: Patient will require antibiotics prior to any dental procedures.  Please contact your provider prior to any dental cleanings or invasive procedures.  No dental cleanings for 3 months after surgery.   Wound care: Monitor wounds  daily. Keep clean, dry, and intact.  Activity:  - Ambulate 2-3 times daily - May drive in 14 days if off of narcotic pain medication.  - Sternal precautions: May lift objects if arms are kept close enough to touch body and avoid pushing/pulling objects with arms x 6 weeks. Avoid stretching both arms backwards for 10 days from surgery. Avoid lifting with one arm for 6 weeks from surgery.  May return to work: When cleared by your surgeon   Cardiac Rehab: N/A   Discharge Diet: Active Orders  Diet   Diet regular    DISCHARGE LABORATORIES (last 3 days): Recent Labs  Lab 09/12/24 0301 09/13/24 0449 09/14/24 0537  NA 136 135 134*  K 4.5 4.1 3.8  CL 96* 94* 98  CO2 29 29 28   BUN 9 14 13   CREATININE 1.0 1.1 0.9  GLUCOSE 133 105 118  CALCIUM 8.4* 8.7 8.3*   Recent Labs  Lab 09/12/24 0301 09/13/24 0449 09/14/24 0537  WBC 6.7 6.1 6.0  HGB 10.1* 9.8* 9.6*  HCT 30.8* 30.0* 28.6*  PLT 103* 120* 164   Recent Labs  Lab 09/09/24 1037 09/10/24 1346 09/10/24 1502  INR 1.2* 1.5* 1.4*   Future Appointments  Date Time Provider Department Center  10/01/2024  8:30 AM [facility] XR 15 [facility]  10/01/2024  9:10 AM LABS-2F2G [id number] [facility]  10/01/2024 10:00 AM Geoffrion, Randine Fuller, MD 2F2G [facility]  11/17/2024  2:00 PM 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 [facility]   Report: chest/anginal pain like before surgery, swelling in feet, legs, or stomach, nausea or vomiting, fever greater than 101.0 F (38.3 C) degrees, bleeding, redness, swelling, pain, or drainage from surgical incision(s), bleeding or swelling at groin catheter access site, difficulty breathing or shortness of breath, dizziness, or weight gain greater than 2 lbs in 1 day or 5 lbs in 1 week  Report Issues: Surgeon's office during office hours and after hours call [phone] and ask for [facility] Surgery Resident on-call  Time Spent with Patient: 45 minutes    [place] HSUAN JU LEE, NP 09/14/2024

## 2024-09-14 NOTE — Progress Notes (Signed)
 Pt met criteria for discharge and discharge order entered by provider. VSS. Discharge instructions reviewed with pt in detail. They reported no questions or concerns. Discharge paperwork signed. 1 PIV removed and pt disconnected from tele. Pt left 6100 via wheelchair accompanied by Care RN. All belongings sent with pt.

## 2024-09-17 ENCOUNTER — Emergency Department (HOSPITAL_COMMUNITY): Payer: MEDICAID

## 2024-09-17 ENCOUNTER — Emergency Department (HOSPITAL_COMMUNITY)
Admission: EM | Admit: 2024-09-17 | Discharge: 2024-09-17 | Disposition: A | Discharge status: Other Acute Inpatient Hospital | Payer: MEDICAID | Attending: Emergency Medicine | Admitting: Emergency Medicine

## 2024-09-17 ENCOUNTER — Encounter (HOSPITAL_COMMUNITY): Payer: Self-pay | Admitting: Emergency Medicine

## 2024-09-17 ENCOUNTER — Other Ambulatory Visit: Payer: Self-pay

## 2024-09-17 DIAGNOSIS — Z7982 Long term (current) use of aspirin: Secondary | ICD-10-CM | POA: Diagnosis not present

## 2024-09-17 DIAGNOSIS — R000 Tachycardia, unspecified: Secondary | ICD-10-CM

## 2024-09-17 DIAGNOSIS — J189 Pneumonia, unspecified organism: Secondary | ICD-10-CM | POA: Diagnosis not present

## 2024-09-17 DIAGNOSIS — J9589 Other postprocedural complications and disorders of respiratory system, not elsewhere classified: Secondary | ICD-10-CM | POA: Insufficient documentation

## 2024-09-17 DIAGNOSIS — R0789 Other chest pain: Secondary | ICD-10-CM | POA: Diagnosis present

## 2024-09-17 DIAGNOSIS — I509 Heart failure, unspecified: Secondary | ICD-10-CM | POA: Diagnosis not present

## 2024-09-17 DIAGNOSIS — M25512 Pain in left shoulder: Secondary | ICD-10-CM | POA: Diagnosis not present

## 2024-09-17 DIAGNOSIS — Z7401 Bed confinement status: Secondary | ICD-10-CM | POA: Diagnosis not present

## 2024-09-17 LAB — COMPREHENSIVE METABOLIC PANEL WITH GFR
ALT: 32 U/L (ref 0–44)
AST: 41 U/L (ref 15–41)
Albumin: 3 g/dL — ABNORMAL LOW (ref 3.5–5.0)
Alkaline Phosphatase: 66 U/L (ref 38–126)
Anion gap: 15 (ref 5–15)
BUN: 11 mg/dL (ref 6–20)
CO2: 24 mmol/L (ref 22–32)
Calcium: 9 mg/dL (ref 8.9–10.3)
Chloride: 97 mmol/L — ABNORMAL LOW (ref 98–111)
Creatinine, Ser: 0.87 mg/dL (ref 0.61–1.24)
GFR, Estimated: >60 mL/min (ref >60)
Glucose, Bld: 111 mg/dL — ABNORMAL HIGH (ref 70–99)
Potassium: 4.5 mmol/L (ref 3.5–5.1)
Sodium: 136 mmol/L (ref 135–145)
Total Bilirubin: 1.9 mg/dL — ABNORMAL HIGH (ref 0.0–1.2)
Total Protein: 6.9 g/dL (ref 6.5–8.1)

## 2024-09-17 LAB — I-STAT CHEM 8, ED
BUN: 12 mg/dL (ref 6–20)
Calcium, Ion: 1.09 mmol/L — ABNORMAL LOW (ref 1.15–1.40)
Chloride: 99 mmol/L (ref 98–111)
Creatinine, Ser: 0.8 mg/dL (ref 0.61–1.24)
Glucose, Bld: 110 mg/dL — ABNORMAL HIGH (ref 70–99)
HCT: 30 % — ABNORMAL LOW (ref 39.0–52.0)
Hemoglobin: 10.2 g/dL — ABNORMAL LOW (ref 13.0–17.0)
Potassium: 4.4 mmol/L (ref 3.5–5.1)
Sodium: 135 mmol/L (ref 135–145)
TCO2: 26 mmol/L (ref 22–32)

## 2024-09-17 LAB — CBC WITH DIFFERENTIAL/PLATELET
Abs Immature Granulocytes: 0.06 K/uL (ref 0.00–0.07)
Basophils Absolute: 0 K/uL (ref 0.0–0.1)
Basophils Relative: 0 %
Eosinophils Absolute: 0.1 K/uL (ref 0.0–0.5)
Eosinophils Relative: 1 %
HCT: 30.5 % — ABNORMAL LOW (ref 39.0–52.0)
Hemoglobin: 10.1 g/dL — ABNORMAL LOW (ref 13.0–17.0)
Immature Granulocytes: 1 %
Lymphocytes Relative: 10 %
Lymphs Abs: 0.9 K/uL (ref 0.7–4.0)
MCH: 32.8 pg (ref 26.0–34.0)
MCHC: 33.1 g/dL (ref 30.0–36.0)
MCV: 99 fL (ref 80.0–100.0)
Monocytes Absolute: 0.9 K/uL (ref 0.1–1.0)
Monocytes Relative: 10 %
Neutro Abs: 7.2 K/uL (ref 1.7–7.7)
Neutrophils Relative %: 78 %
Platelets: 315 K/uL (ref 150–400)
RBC: 3.08 MIL/uL — ABNORMAL LOW (ref 4.22–5.81)
RDW: 17.5 % — ABNORMAL HIGH (ref 11.5–15.5)
WBC: 9.2 K/uL (ref 4.0–10.5)
nRBC: 0 % (ref 0.0–0.2)

## 2024-09-17 LAB — I-STAT CG4 LACTIC ACID, ED: Lactic Acid, Venous: 1.3 mmol/L (ref 0.5–1.9)

## 2024-09-17 LAB — URINALYSIS, W/ REFLEX TO CULTURE (INFECTION SUSPECTED)
Bacteria, UA: NONE SEEN
Bilirubin Urine: NEGATIVE
Glucose, UA: NEGATIVE mg/dL
Hgb urine dipstick: NEGATIVE
Ketones, ur: 5 mg/dL — AB
Leukocytes,Ua: NEGATIVE
Nitrite: NEGATIVE
Protein, ur: NEGATIVE mg/dL
Specific Gravity, Urine: 1.042 — ABNORMAL HIGH (ref 1.005–1.030)
pH: 7 (ref 5.0–8.0)

## 2024-09-17 LAB — RESP PANEL BY RT-PCR (RSV, FLU A&B, COVID)  RVPGX2
Influenza A by PCR: NEGATIVE
Influenza B by PCR: NEGATIVE
Resp Syncytial Virus by PCR: NEGATIVE
SARS Coronavirus 2 by RT PCR: NEGATIVE

## 2024-09-17 LAB — TROPONIN I (HIGH SENSITIVITY)
Troponin I (High Sensitivity): 4066 ng/L (ref <18)
Troponin I (High Sensitivity): 3170 ng/L (ref <18)

## 2024-09-17 LAB — PROTIME-INR
INR: 1.1 (ref 0.8–1.2)
Prothrombin Time: 15.2 s (ref 11.4–15.2)

## 2024-09-17 MED ORDER — ONDANSETRON HCL 4 MG/2ML IJ SOLN
4.0000 mg | Freq: Once | INTRAMUSCULAR | Status: AC
  Administered 2024-09-17: 4 mg via INTRAVENOUS
  Filled 2024-09-17: qty 2

## 2024-09-17 MED ORDER — IOHEXOL 350 MG/ML SOLN
75.0000 mL | Freq: Once | INTRAVENOUS | Status: AC | PRN
  Administered 2024-09-17: 75 mL via INTRAVENOUS

## 2024-09-17 MED ORDER — LIDOCAINE 5 % EX PTCH
1.0000 | MEDICATED_PATCH | CUTANEOUS | Status: DC
  Administered 2024-09-17: 1 via TRANSDERMAL
  Filled 2024-09-17: qty 1

## 2024-09-17 MED ORDER — SODIUM CHLORIDE 0.9 % IV BOLUS
500.0000 mL | Freq: Once | INTRAVENOUS | Status: AC
Start: 2024-09-17 — End: 2024-09-17
  Administered 2024-09-17: 500 mL via INTRAVENOUS

## 2024-09-17 MED ORDER — HYDROMORPHONE HCL 1 MG/ML IJ SOLN
0.5000 mg | Freq: Once | INTRAMUSCULAR | Status: AC
  Administered 2024-09-17: 0.5 mg via INTRAVENOUS
  Filled 2024-09-17: qty 1

## 2024-09-17 MED ORDER — HYDROMORPHONE HCL 1 MG/ML IJ SOLN
1.0000 mg | Freq: Once | INTRAMUSCULAR | Status: AC
  Administered 2024-09-17: 1 mg via INTRAVENOUS
  Filled 2024-09-17: qty 1

## 2024-09-17 MED ORDER — SODIUM CHLORIDE 0.9 % IV SOLN
2.0000 g | Freq: Once | INTRAVENOUS | Status: AC
  Administered 2024-09-17: 2 g via INTRAVENOUS
  Filled 2024-09-17: qty 12.5

## 2024-09-17 MED ORDER — METHOCARBAMOL 1000 MG/10ML IJ SOLN
1000.0000 mg | Freq: Once | INTRAMUSCULAR | Status: AC
  Administered 2024-09-17: 1000 mg via INTRAVENOUS
  Filled 2024-09-17: qty 10

## 2024-09-17 MED ORDER — VANCOMYCIN HCL IN DEXTROSE 1-5 GM/200ML-% IV SOLN
1000.0000 mg | Freq: Once | INTRAVENOUS | Status: AC
  Administered 2024-09-17: 1000 mg via INTRAVENOUS
  Filled 2024-09-17: qty 200

## 2024-09-17 NOTE — ED Provider Triage Note (Signed)
 Emergency Medicine Provider Triage Evaluation Note  [place] Hebert , a 28 y.o. male  was evaluated in triage.  Pt complains of chest pain, shortness of breath, tachycardia, chills which began yesterday and worsened today.  Patient had heart surgery last week with a valve replacement.  Surgery was  1 week ago at [facility].  Review of Systems  Positive:  Negative:   Physical Exam  BP 120/76   Pulse (!) 117   Temp 99.9 F (37.7 C)   Resp (!) 26   SpO2 100%  Gen:   Awake, no distress   Resp:  Normal effort  MSK:   Moves extremities without difficulty  Other:    Medical Decision Making  Medically screening exam initiated at 12:31 AM.  Appropriate orders placed.  Jatinder Shawler was informed that the remainder of the evaluation will be completed by another provider, this initial triage assessment does not replace that evaluation, and the importance of remaining in the ED until their evaluation is complete.  Patient needs a room. Charge nurse aware   Logan Ubaldo NOVAK, PA-C 09/17/24 [phone]

## 2024-09-17 NOTE — ED Provider Notes (Signed)
 [place] EMERGENCY DEPARTMENT AT [facility] Provider Note  CSN: [id number] Arrival date & time: 09/17/24 0014  Chief Complaint(s) Recent Heart Surgery and Tachycardia  HPI Cole Hebert is a 28 y.o. 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  Discharge 10/6 from [facility].  Returns today for chest tightness, shortness of breath and tachycardia while at home.  He denied any fevers but EMS noted a temperature of 99.9.  He reports nausea but no vomiting.  States that he feels like he needs to cough.  Unable to because of the pain.  States that he has been able to tolerate liquids, but food exacerbates his chest wall pain from surgery.  The history is provided by the patient.    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 Authorizing Provider  aspirin  EC 81 MG tablet Take 81 mg by mouth daily. Swallow whole.    [provider]  ferrous sulfate 325 (65 FE) MG tablet Take 325 mg by mouth every Monday, Wednesday, and Friday. 12/07/22   [provider]  folic acid-pyridoxine-cyancobalamin (WESTAB MAX) 2.5-25-2 MG TABS tablet Take 1 tablet by mouth daily.    [provider]  losartan  (COZAAR ) 25 MG tablet Take 0.5 tablets (12.5 mg total) by mouth daily. 07/03/21   Ezenduka, Nkeiruka J, MD  spironolactone  (ALDACTONE ) 25 MG tablet Take 0.5 tablets by mouth daily. 12/06/22   [provider]                                                                                                                                     Allergies Patient has no known allergies.  Review of Systems Review of Systems As noted in HPI  Physical Exam Vital Signs  I have reviewed the triage vital signs BP 115/72   Pulse (!) 116   Temp 98.7 F (37.1 C)   Resp 20   Wt 56.7 kg   SpO2 100%   BMI 21.46 kg/m   Physical Exam Vitals reviewed.  Constitutional:      General: He is not in acute distress.    Appearance: He is well-developed. He is not diaphoretic.  HENT:     Head: Normocephalic and atraumatic.     Nose: Nose normal.  Eyes:     General: No scleral icterus.       Right eye: No discharge.        Left eye: No discharge.     Conjunctiva/sclera: Conjunctivae normal.  Pupils: Pupils are equal, round, and reactive to light.  Cardiovascular:     Rate and Rhythm: Regular rhythm. Tachycardia present.     Heart sounds: No murmur heard.    No friction rub. No gallop.  Pulmonary:     Effort: Pulmonary effort is normal. Tachypnea present.     Breath sounds: No stridor. Examination of the right-lower field reveals rales. Examination of the left-lower field reveals rales. Rales present.     Comments: Shallow breathing Chest:     Chest wall: Tenderness present.    Abdominal:     General: There is no distension.     Palpations: Abdomen is soft.     Tenderness: There is no abdominal tenderness.  Musculoskeletal:        General: No tenderness.     Cervical back: Normal range of motion and neck supple.  Skin:    General: Skin is warm and dry.     Findings: No erythema or rash.  Neurological:     Mental Status: He is alert and oriented to person, place, and time.     ED Results and Treatments Labs (all labs ordered are listed, but only abnormal results are displayed) Labs Reviewed  COMPREHENSIVE METABOLIC PANEL WITH GFR - Abnormal; Notable for the following components:      Result Value   Chloride 97 (*)    Glucose, Bld 111 (*)    Albumin 3.0 (*)    Total Bilirubin 1.9 (*)    All other components  within normal limits  CBC WITH DIFFERENTIAL/PLATELET - Abnormal; Notable for the following components:   RBC 3.08 (*)    Hemoglobin 10.1 (*)    HCT 30.5 (*)    RDW 17.5 (*)    All other components within normal limits  URINALYSIS, W/ REFLEX TO CULTURE (INFECTION SUSPECTED) - Abnormal; Notable for the following components:   Specific Gravity, Urine 1.042 (*)    Ketones, ur 5 (*)    All other components within normal limits  I-STAT CHEM 8, ED - Abnormal; Notable for the following components:   Glucose, Bld 110 (*)    Calcium, Ion 1.09 (*)    Hemoglobin 10.2 (*)    HCT 30.0 (*)    All other components within normal limits  TROPONIN I (HIGH SENSITIVITY) - Abnormal; Notable for the following components:   Troponin I (High Sensitivity) 4,066 (*)    All other components within normal limits  TROPONIN I (HIGH SENSITIVITY) - Abnormal; Notable for the following components:   Troponin I (High Sensitivity) 3,170 (*)    All other components within normal limits  RESP PANEL BY RT-PCR (RSV, FLU A&B, COVID)  RVPGX2  CULTURE, BLOOD (ROUTINE X 2)  CULTURE, BLOOD (ROUTINE X 2)  PROTIME-INR  I-STAT CG4 LACTIC ACID, ED                                                                                                                         EKG  EKG Interpretation Date/Time:  Thursday September 17 2024 07:50:27 EDT Ventricular Rate:  125 PR Interval:    QRS Duration:  156 QT Interval:  345 QTC Calculation: 498 R Axis:   8  Text Interpretation: Sinus tachycardia Nonspecific intraventricular conduction delay ST depr, consider ischemia, anterior leads ST depression V1-V3, suggest recording posterior leads Confirmed by Trine Likes [phone]) on 09/17/2024 7:51:11 AM       Radiology CT Angio Chest PE W and/or Wo Contrast Result Date: 09/17/2024 CLINICAL DATA:  Pulmonary embolism, dyspnea, tachycardia, palpitations. Tetralogy of Fallot repair EXAM: CT ANGIOGRAPHY CHEST WITH CONTRAST TECHNIQUE:  Multidetector CT imaging of the chest was performed using the standard protocol during bolus administration of intravenous contrast. Multiplanar CT image reconstructions and MIPs were obtained to evaluate the vascular anatomy. RADIATION DOSE REDUCTION: This exam was performed according to the departmental dose-optimization program which includes automated exposure control, adjustment of the mA and/or kV according to patient size and/or use of iterative reconstruction technique. CONTRAST:  75mL OMNIPAQUE  IOHEXOL  350 MG/ML SOLN COMPARISON:  None FINDINGS: Cardiovascular: Median sternotomy has been performed. Mild cardiomegaly with right ventricular and right atrial enlargement. 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 Asymmetric enlargement of the right pulmonary artery may reflect chronic changes related to remote Blalock-Taussig shunt or jet phenomena related to the conduit. No intraluminal filling defect identified to suggest acute pulmonary embolism. The descending thoracic aorta is relatively diminutive in caliber measuring up to 15 mm the level of the left atrium, but is otherwise unremarkable. No pericardial effusion. Infiltrative mediastinal hemorrhage and pneumomediastinum likely represent postsurgical change. No mass formed hematoma identified. Mediastinum/Nodes: Visualized thyroid is unremarkable. No pathologic thoracic adenopathy. Esophagus is unremarkable. Lungs/Pleura: There is multifocal ground-glass pulmonary infiltrate throughout the left lung with focal consolidation within the basilar lingula which may reflect changes of multifocal pneumonia or asymmetric pulmonary edema none right basilar atelectasis. No pneumothorax. No pleural effusion. Upper Abdomen: No acute abnormality. Musculoskeletal: No  chest wall abnormality. No acute or significant osseous findings. Review of the MIP images confirms the above findings. IMPRESSION: 1. No pulmonary embolism. 2. 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 Mild cardiomegaly with right ventricular and right atrial enlargement. 3. Asymmetric enlargement of the right pulmonary artery may reflect chronic changes related to remote Blalock-Taussig shunt or jet phenomena related to the conduit. 4. Multifocal ground-glass pulmonary infiltrate throughout the left lung with focal consolidation within the basilar lingula which may reflect changes of multifocal pneumonia or asymmetric pulmonary edema. 5. Infiltrative mediastinal hemorrhage and pneumomediastinum likely represent postsurgical change. No mass forming hematoma identified. Electronically Signed   By: Dorethia Molt M.D.   On: 09/17/2024 03:32   DG Chest Port 1 View Result Date: 09/17/2024 CLINICAL DATA:  Questionable sepsis - evaluate for abnormality EXAM: PORTABLE CHEST 1 VIEW COMPARISON:  Chest x-ray 05/03/2024 trauma CT abdomen pelvis 05/11/2022 FINDINGS: The heart and mediastinal contours are unchanged. Interval development of left mid lower lung zone patchy airspace opacity. No pulmonary edema. Likely trace to small volume left pleural effusion pleural effusion. No pneumothorax. No acute osseous abnormality. IMPRESSION: Interval development of left mid lower lung zone patchy airspace opacity. Likely trace to small volume left pleural effusion pleural effusion. Followup PA and lateral chest X-ray is recommended in 3-4 weeks following therapy to ensure resolution. Electronically  Signed   By: Morgane  Naveau M.D.   On: 09/17/2024 01:15    Medications Ordered in ED Medications  lidocaine  (LIDODERM ) 5 % 1 patch (1 patch Transdermal Patch Applied 09/17/24 0232)  HYDROmorphone (DILAUDID) injection 1 mg (has no  administration in time range)  HYDROmorphone (DILAUDID) injection 0.5 mg (0.5 mg Intravenous Given 09/17/24 0233)  methocarbamol (ROBAXIN) injection 1,000 mg (1,000 mg Intravenous Given 09/17/24 0349)  sodium chloride  0.9 % bolus 500 mL (0 mLs Intravenous Stopped 09/17/24 0401)  ondansetron  (ZOFRAN ) injection 4 mg (4 mg Intravenous Given 09/17/24 0232)  iohexol  (OMNIPAQUE ) 350 MG/ML injection 75 mL (75 mLs Intravenous Contrast Given 09/17/24 0309)  vancomycin (VANCOCIN) IVPB 1000 mg/200 mL premix (0 mg Intravenous Stopped 09/17/24 0752)  ceFEPIme (MAXIPIME) 2 g in sodium chloride  0.9 % 100 mL IVPB (0 g Intravenous Stopped 09/17/24 0647)   Procedures Procedures  (including critical care time) Medical Decision Making / ED Course   Medical Decision Making Amount and/or Complexity of Data Reviewed Labs: ordered. Decision-making details documented in ED Course. Radiology: ordered and independent interpretation performed. Decision-making details documented in ED Course. ECG/medicine tests: ordered and independent interpretation performed. Decision-making details documented in ED Course.  Risk Prescription drug management.    Chest pain and shortness of breath.  Postop day 7.  Low-grade temp.  Patient is tachycardic and tachypneic but satting well on room air.  Differential diagnosis considered.  Workup negative for PE or obvious postoperative complications other than developing pneumonia.  He is not septic with normal lactic acid and no leukocytosis.  Renal function intact.  Troponins are elevated as expected and downtrending.  Given Abx.  Spoke with Dr. Olinda from [facility] cardiothoracic surgery who accepted patient in transfer.    Final Clinical Impression(s) / ED Diagnoses Final diagnoses:  Post-op pneumonia  Tachycardia     This chart was dictated using voice recognition software.  Despite best efforts to proofread,  errors can occur which can change the documentation meaning.     Trine Raynell Moder, MD 09/17/24 [phone]

## 2024-09-17 NOTE — ED Triage Notes (Signed)
 Pt in from home via [facility] after 7th Open Heart surgery at [facility] last week for Trilogy of Fallot. Pt reports SOB, heart racing - has been 120's HR en route, 32RR, 130's/70's. LR given 18GLAC. Temp 99.9 oral

## 2024-09-17 NOTE — ED Notes (Signed)
 Spoke with Consulting civil engineer at [facility] accepting floor, she requested report handoff be done after 730 am or when transport arrives for patient.

## 2024-09-22 LAB — CULTURE, BLOOD (ROUTINE X 2)
Culture: NO GROWTH
Culture: NO GROWTH
Special Requests: ADEQUATE

## 2024-11-18 ENCOUNTER — Encounter (HOSPITAL_COMMUNITY): Payer: Self-pay

## 2024-11-18 ENCOUNTER — Telehealth (HOSPITAL_COMMUNITY): Payer: Self-pay

## 2024-11-18 NOTE — Telephone Encounter (Signed)
 Outside/paper referral received by Camitta from [facility]. Called and left message for pt to see if he is interested in the cardiac rehab program.  Mailed letter

## 2024-11-18 NOTE — Telephone Encounter (Signed)
 Outside/paper referral received by Dr. Vita from [facility]. Will fax over Physician order and request further documents. Insurance benefits and eligibility to be determined.

## 2024-11-18 NOTE — Telephone Encounter (Signed)
 Pt returned phone call and is interested in the cardiac rehab program. Will pass pt information to the nurse navigator for review. Pt had a cardiology f/u on 12/9

## 2025-01-06 ENCOUNTER — Telehealth (HOSPITAL_COMMUNITY): Payer: Self-pay

## 2025-01-06 ENCOUNTER — Encounter (HOSPITAL_COMMUNITY): Payer: Self-pay

## 2025-01-06 NOTE — Telephone Encounter (Signed)
 Attempted to call patient to schedule cardiac rehab- no answer, unable to leave message. Mailed letter.
